# Patient Record
Sex: Female | Born: 1955 | Race: White | Hispanic: No | Marital: Married | Smoking: Never smoker
Health system: Southern US, Community
[De-identification: ages and names within clinical notes are randomized; demographics above are authoritative.]

## PROBLEM LIST (undated history)

## (undated) DIAGNOSIS — R768 Other specified abnormal immunological findings in serum: Secondary | ICD-10-CM

## (undated) DIAGNOSIS — E669 Obesity, unspecified: Secondary | ICD-10-CM

## (undated) DIAGNOSIS — I341 Nonrheumatic mitral (valve) prolapse: Secondary | ICD-10-CM

## (undated) DIAGNOSIS — I7300 Raynaud's syndrome without gangrene: Secondary | ICD-10-CM

## (undated) DIAGNOSIS — I471 Supraventricular tachycardia, unspecified: Secondary | ICD-10-CM

## (undated) DIAGNOSIS — I82409 Acute embolism and thrombosis of unspecified deep veins of unspecified lower extremity: Secondary | ICD-10-CM

## (undated) DIAGNOSIS — M1711 Unilateral primary osteoarthritis, right knee: Secondary | ICD-10-CM

## (undated) DIAGNOSIS — R238 Other skin changes: Secondary | ICD-10-CM

## (undated) DIAGNOSIS — Z8616 Personal history of COVID-19: Secondary | ICD-10-CM

## (undated) DIAGNOSIS — R809 Proteinuria, unspecified: Secondary | ICD-10-CM

## (undated) DIAGNOSIS — M170 Bilateral primary osteoarthritis of knee: Secondary | ICD-10-CM

## (undated) DIAGNOSIS — I743 Embolism and thrombosis of arteries of the lower extremities: Secondary | ICD-10-CM

## (undated) DIAGNOSIS — Z8249 Family history of ischemic heart disease and other diseases of the circulatory system: Secondary | ICD-10-CM

## (undated) DIAGNOSIS — I480 Paroxysmal atrial fibrillation: Secondary | ICD-10-CM

## (undated) DIAGNOSIS — K222 Esophageal obstruction: Secondary | ICD-10-CM

## (undated) DIAGNOSIS — F909 Attention-deficit hyperactivity disorder, unspecified type: Secondary | ICD-10-CM

## (undated) DIAGNOSIS — R233 Spontaneous ecchymoses: Secondary | ICD-10-CM

## (undated) DIAGNOSIS — R1310 Dysphagia, unspecified: Secondary | ICD-10-CM

## (undated) HISTORY — DX: Bilateral primary osteoarthritis of knee: M17.0

## (undated) HISTORY — DX: Embolism and thrombosis of arteries of the lower extremities: I74.3

## (undated) HISTORY — DX: Paroxysmal atrial fibrillation: I48.0

## (undated) HISTORY — DX: Spontaneous ecchymoses: R23.3

## (undated) HISTORY — DX: Other skin changes: R23.8

## (undated) HISTORY — DX: Supraventricular tachycardia, unspecified: I47.10

## (undated) HISTORY — DX: Supraventricular tachycardia: I47.1

## (undated) HISTORY — DX: Dysphagia, unspecified: R13.10

## (undated) HISTORY — DX: Nonrheumatic mitral (valve) prolapse: I34.1

## (undated) HISTORY — PX: VASCULAR SURGERY: SHX849

## (undated) HISTORY — DX: Other specified abnormal immunological findings in serum: R76.8

## (undated) HISTORY — DX: Obesity, unspecified: E66.9

## (undated) HISTORY — DX: Raynaud's syndrome without gangrene: I73.00

## (undated) HISTORY — DX: Family history of ischemic heart disease and other diseases of the circulatory system: Z82.49

## (undated) HISTORY — DX: Unilateral primary osteoarthritis, right knee: M17.11

## (undated) HISTORY — DX: Esophageal obstruction: K22.2

## (undated) HISTORY — DX: Proteinuria, unspecified: R80.9

## (undated) HISTORY — DX: Attention-deficit hyperactivity disorder, unspecified type: F90.9

## (undated) HISTORY — DX: Personal history of COVID-19: Z86.16

## (undated) HISTORY — DX: Acute embolism and thrombosis of unspecified deep veins of unspecified lower extremity: I82.409

---

## 2002-08-15 ENCOUNTER — Ambulatory Visit (HOSPITAL_COMMUNITY): Admission: RE | Admit: 2002-08-15 | Discharge: 2002-08-15 | Payer: Self-pay | Admitting: Internal Medicine

## 2002-08-15 ENCOUNTER — Encounter: Payer: Self-pay | Admitting: Internal Medicine

## 2002-09-02 ENCOUNTER — Encounter: Admission: RE | Admit: 2002-09-02 | Discharge: 2002-09-02 | Payer: Self-pay | Admitting: Internal Medicine

## 2003-01-16 ENCOUNTER — Encounter: Payer: Self-pay | Admitting: Internal Medicine

## 2003-01-16 ENCOUNTER — Encounter: Admission: RE | Admit: 2003-01-16 | Discharge: 2003-01-16 | Payer: Self-pay | Admitting: Internal Medicine

## 2003-04-22 ENCOUNTER — Encounter: Admission: RE | Admit: 2003-04-22 | Discharge: 2003-04-22 | Payer: Self-pay | Admitting: Internal Medicine

## 2004-02-17 ENCOUNTER — Encounter: Admission: RE | Admit: 2004-02-17 | Discharge: 2004-02-17 | Payer: Self-pay | Admitting: Internal Medicine

## 2005-08-10 ENCOUNTER — Emergency Department: Payer: Self-pay | Admitting: Emergency Medicine

## 2011-05-16 ENCOUNTER — Telehealth: Payer: Self-pay | Admitting: Internal Medicine

## 2011-05-16 DIAGNOSIS — F909 Attention-deficit hyperactivity disorder, unspecified type: Secondary | ICD-10-CM

## 2011-05-16 NOTE — Telephone Encounter (Signed)
Pt called she needs rx addrell  40mg  in am  20mg  at lunch  Please call when ready  Pt signed medical release form at [facility]  Pt will be out of meds 05/18/11

## 2011-05-18 MED ORDER — AMPHETAMINE-DEXTROAMPHETAMINE 20 MG PO TABS
ORAL_TABLET | ORAL | Status: DC
Start: 2011-05-18 — End: 2011-06-05

## 2011-05-18 NOTE — Telephone Encounter (Signed)
I have printed out an rx for #90 20 mg tablets of Adderall which is a one month supply on current regimen oof 40 mg am 20 mg pm

## 2011-05-18 NOTE — Telephone Encounter (Signed)
Patient needs a refill on her Adderall.  She stated at her last visit you gave her an Rx that was for 10 mg tablets 2 tabs at 7 am and 2 tabs at noon.  She stated you then called her after that because that dose was not working for her and changed her to 40 mg at 7 am and 20 mg at noon.   I called the pharmacy and they confirmed the first dose you prescribed and stated her last refill was on 04/20/11 and she got 120 tablets.  We do not have her records from the other office and she needs the Rx soon.  Please advise on what dose and directions of Adderall.

## 2011-05-18 NOTE — Telephone Encounter (Signed)
Notified patient she will pick up Rx.

## 2011-06-05 ENCOUNTER — Other Ambulatory Visit: Payer: Self-pay | Admitting: Internal Medicine

## 2011-06-05 DIAGNOSIS — F909 Attention-deficit hyperactivity disorder, unspecified type: Secondary | ICD-10-CM

## 2011-06-05 MED ORDER — AMPHETAMINE-DEXTROAMPHETAMINE 20 MG PO TABS: ORAL_TABLET | ORAL | Status: DC

## 2011-07-31 ENCOUNTER — Other Ambulatory Visit: Payer: Self-pay | Admitting: Internal Medicine

## 2011-07-31 NOTE — Telephone Encounter (Signed)
Patient takes 2- 20 mg tabs in the morning and 1- 20 mg tab in the afternoon. Please write for two months at a time because she lives so far away.

## 2011-08-02 ENCOUNTER — Other Ambulatory Visit: Payer: Self-pay | Admitting: Internal Medicine

## 2011-08-02 DIAGNOSIS — F909 Attention-deficit hyperactivity disorder, unspecified type: Secondary | ICD-10-CM

## 2011-08-02 MED ORDER — AMPHETAMINE-DEXTROAMPHETAMINE 20 MG PO TABS: ORAL_TABLET | ORAL | Status: DC

## 2011-09-05 ENCOUNTER — Other Ambulatory Visit: Payer: Self-pay | Admitting: Internal Medicine

## 2011-09-05 MED ORDER — AMPHETAMINE-DEXTROAMPHETAMINE 20 MG PO TABS: ORAL_TABLET | ORAL | Status: DC

## 2011-09-05 NOTE — Telephone Encounter (Signed)
Patient needing a new script on her  Adderall 60 mg.

## 2011-11-09 ENCOUNTER — Ambulatory Visit: Payer: Self-pay | Admitting: Internal Medicine

## 2011-11-10 ENCOUNTER — Ambulatory Visit: Payer: Self-pay | Admitting: Internal Medicine

## 2011-11-13 ENCOUNTER — Encounter: Payer: Self-pay | Admitting: Internal Medicine

## 2011-11-13 ENCOUNTER — Ambulatory Visit (INDEPENDENT_AMBULATORY_CARE_PROVIDER_SITE_OTHER): Payer: Self-pay | Admitting: Internal Medicine

## 2011-11-13 DIAGNOSIS — F909 Attention-deficit hyperactivity disorder, unspecified type: Secondary | ICD-10-CM

## 2011-11-13 DIAGNOSIS — E669 Obesity, unspecified: Secondary | ICD-10-CM

## 2011-11-13 DIAGNOSIS — E785 Hyperlipidemia, unspecified: Secondary | ICD-10-CM

## 2011-11-13 DIAGNOSIS — F988 Other specified behavioral and emotional disorders with onset usually occurring in childhood and adolescence: Secondary | ICD-10-CM

## 2011-11-13 MED ORDER — ADDERALL 20 MG PO TABS: ORAL_TABLET | ORAL | Status: DC

## 2011-11-13 NOTE — Patient Instructions (Signed)
Consider the Low Glycemic Index Diet and 6 smaller meals daily :  Here is one way of accomplishing this   7 AM Low carbohydrate Protein  Shakes (EAS Carb Control  Or Atkins ,  Available everywhere,   In  cases at BJs )  2.5 carbs  (Add or substitute a toasted sandwhich thin w/ peanut butter)  10 AM: Protein bar by Atkins (snack size,  Chocolate lover's variety at  BJ's)    Lunch: sandwich on pita bread or flatbread (Joseph's makes a pita bread and a flat bread , available at [employer] and [employer]J's; Toufayah makes a low carb flatbread available at [employer] and [employer])   [employer] both make a low carb whole wheat tortillas   3 PM:  Mid day :  Another protein bar,  Or a  cheese stick, 1/4 cup of almonds, walnuts, pistachios, pecans, peanuts,  Macadamia nuts  6 PM  Dinner:  "mean and green:"  Meat/chicken/fish, salad, and green veggie : use ranch, vinagrette,  Blue cheese, etc  9 PM snack : Breyer's low carb fudgiscle or  ice cream bar (Carb Smart),  Weight Watcher's ice cream bar , or another protein shake  _________________________________________________________________________________________________________________

## 2011-11-13 NOTE — Progress Notes (Signed)
Patient ID: Jade Edwards, female   DOB: [birth year], 56 y.o.   MRN: [id number]    Patient Active Problem List  Diagnoses  . Obesity  . 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 Follow-up  . Medication Refill    HPI:   Jade Edwards a 56 y.o. female who presents with for follow up on ADD and obesity.  She has been taking adderall twice daily for management of ADD.  She forgets to take her afternoon dose of medication  2 or 3 times per week.  Her children often remind her to take her dose.  She has incomnia frequently,  And her emotional state has been distracted since her husband was diagnosed with advanced prostate CA recently.   She has lost 29 lbs through diet and exercise but has gained several lbs over the family vacation holiday.  No past medical history on file.  No past surgical history on file.       The following portions of the patient's history were reviewed and updated as appropriate: Allergies, current medications, and problem list.    Review of Systems:   12 Pt  review of systems was negative except those addressed in the HPI,     History   Social History  . Marital Status: Single    Spouse Name: N/A    Number of Children: N/A  . Years of Education: N/A   Occupational History  . Not on file.   Social History Main Topics  . Smoking status: Never Smoker   . Smokeless tobacco: Never Used  . Alcohol Use: No  . Drug Use: No  . Sexually Active: Not on file   Other Topics Concern  . Not on file   Social History Narrative  . No narrative on file    Objective:  BP 128/70  Pulse 100  Temp(Src) 98.2 F (36.8 C) (Oral)  Resp 16  Ht 5' 1.5" (1.562 m)  Wt 224 lb 8 oz (101.833 kg)  BMI 41.73 kg/m2  SpO2 100%  General appearance: alert, cooperative and appears stated age Ears: normal TM's and external ear canals both ears Throat: lips, mucosa, and tongue  normal; teeth and gums normal Neck: no adenopathy, no carotid bruit, supple, symmetrical, trachea midline and thyroid not enlarged, symmetric, no tenderness/mass/nodules Back: symmetric, no curvature. ROM normal. No CVA tenderness. Lungs: clear to auscultation bilaterally Heart: regular rate and rhythm, S1, S2 normal, no murmur, click, rub or gallop Abdomen: soft, non-tender; bowel sounds normal; no masses,  no organomegaly Pulses: 2+ and symmetric Skin: Skin color, texture, turgor normal. No rashes or lesions Lymph nodes: Cervical, supraclavicular, and axillary nodes normal.  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  We decided to continue her current regimen .    Updated Medication List Outpatient Encounter Prescriptions as of 11/13/2011  Medication Sig Dispense Refill  . ADDERALL, 20MG , 20 MG tablet Take two tablets in the morning and one tablet in the afternoon.  180 tablet  0  . DISCONTD: amphetamine-dextroamphetamine (ADDERALL, 20MG ,) 20 MG tablet Take two tablets in the morning and one tablet in the afternoon.  180 tablet  0     Orders Placed This Encounter  Procedures  . TSH  . Lipid panel  . COMPLETE METABOLIC PANEL WITH GFR    No Follow-up  on file.

## 2011-11-14 DIAGNOSIS — F909 Attention-deficit hyperactivity disorder, unspecified type: Secondary | ICD-10-CM | POA: Insufficient documentation

## 2011-11-14 DIAGNOSIS — Z6841 Body Mass Index (BMI) 40.0 and over, adult: Secondary | ICD-10-CM | POA: Insufficient documentation

## 2011-11-14 NOTE — Assessment & Plan Note (Signed)
We discussed the pros and cons of switching her to extended releaese Adderall.  We decided to continue her current regimen .

## 2011-11-22 ENCOUNTER — Other Ambulatory Visit (INDEPENDENT_AMBULATORY_CARE_PROVIDER_SITE_OTHER): Payer: BC Managed Care – PPO | Admitting: *Deleted

## 2011-11-22 DIAGNOSIS — E785 Hyperlipidemia, unspecified: Secondary | ICD-10-CM

## 2011-11-22 LAB — LIPID PANEL
LDL Cholesterol: 115 mg/dL — ABNORMAL HIGH (ref 0–99)
Total CHOL/HDL Ratio: 4
VLDL: 30.8 mg/dL (ref 0.0–40.0)

## 2011-11-22 LAB — COMPLETE METABOLIC PANEL WITH GFR
ALT: 18 U/L (ref 0–35)
AST: 22 U/L (ref 0–37)
Albumin: 4.1 g/dL (ref 3.5–5.2)
Alkaline Phosphatase: 109 U/L (ref 39–117)
Calcium: 8.9 mg/dL (ref 8.4–10.5)
Chloride: 105 mEq/L (ref 96–112)
Potassium: 4.3 mEq/L (ref 3.5–5.3)
Sodium: 141 mEq/L (ref 135–145)
Total Protein: 6.6 g/dL (ref 6.0–8.3)

## 2011-11-23 ENCOUNTER — Encounter: Payer: Self-pay | Admitting: Internal Medicine

## 2012-01-17 ENCOUNTER — Telehealth: Payer: Self-pay | Admitting: Internal Medicine

## 2012-01-17 MED ORDER — ADDERALL 20 MG PO TABS: ORAL_TABLET | ORAL | Status: DC

## 2012-01-17 NOTE — Telephone Encounter (Signed)
Refill Adderall 20 mg  For 2 months.

## 2012-01-17 NOTE — Telephone Encounter (Signed)
Rx printed waiting on signature.

## 2012-04-08 ENCOUNTER — Other Ambulatory Visit: Payer: Self-pay | Admitting: Internal Medicine

## 2012-04-08 NOTE — Telephone Encounter (Signed)
Refill on Adderall 20 mg 2 months.

## 2012-04-09 MED ORDER — ADDERALL 20 MG PO TABS: ORAL_TABLET | ORAL | Status: DC

## 2012-04-11 ENCOUNTER — Encounter: Payer: Self-pay | Admitting: Internal Medicine

## 2012-06-11 ENCOUNTER — Other Ambulatory Visit: Payer: Self-pay | Admitting: Internal Medicine

## 2012-06-11 NOTE — Telephone Encounter (Signed)
Refill on Adderall 20 mg. °

## 2012-06-12 MED ORDER — ADDERALL 20 MG PO TABS: ORAL_TABLET | ORAL | Status: DC

## 2012-06-12 NOTE — Telephone Encounter (Signed)
Ok to refill,  Authorized in epic and printed  

## 2012-06-13 NOTE — Telephone Encounter (Signed)
Patient's husband came by and picked prescription up.

## 2012-08-22 ENCOUNTER — Other Ambulatory Visit: Payer: Self-pay | Admitting: Internal Medicine

## 2012-08-22 DIAGNOSIS — F909 Attention-deficit hyperactivity disorder, unspecified type: Secondary | ICD-10-CM

## 2012-08-22 NOTE — Telephone Encounter (Signed)
Pt need addrell  refill  Pt would like 2 month at a time Please advise when ready to be picked

## 2012-08-23 MED ORDER — ADDERALL 20 MG PO TABS: ORAL_TABLET | ORAL | Status: DC

## 2012-08-23 MED ORDER — AMPHETAMINE-DEXTROAMPHETAMINE 20 MG PO TABS: ORAL_TABLET | ORAL | Status: DC

## 2012-08-23 NOTE — Telephone Encounter (Signed)
Patient's husband would like to pick this up today around 2:30.  She will be out after today.

## 2012-08-23 NOTE — Telephone Encounter (Signed)
Dr. Darrick Huntsman put in drawer at front desk.

## 2012-08-23 NOTE — Telephone Encounter (Signed)
Ok to refill,  Authorized in epic on printer 

## 2012-09-04 ENCOUNTER — Ambulatory Visit: Payer: BC Managed Care – PPO | Admitting: Internal Medicine

## 2012-09-13 ENCOUNTER — Ambulatory Visit: Payer: BC Managed Care – PPO | Admitting: Internal Medicine

## 2012-09-23 ENCOUNTER — Ambulatory Visit (INDEPENDENT_AMBULATORY_CARE_PROVIDER_SITE_OTHER): Payer: BC Managed Care – PPO | Admitting: Internal Medicine

## 2012-09-23 ENCOUNTER — Encounter: Payer: Self-pay | Admitting: Internal Medicine

## 2012-09-23 VITALS — BP 130/74 | HR 79 | Temp 97.9°F | Resp 16 | Wt 233.0 lb

## 2012-09-23 DIAGNOSIS — E669 Obesity, unspecified: Secondary | ICD-10-CM

## 2012-09-23 DIAGNOSIS — Z1239 Encounter for other screening for malignant neoplasm of breast: Secondary | ICD-10-CM

## 2012-09-23 DIAGNOSIS — M5430 Sciatica, unspecified side: Secondary | ICD-10-CM

## 2012-09-23 DIAGNOSIS — M25559 Pain in unspecified hip: Secondary | ICD-10-CM

## 2012-09-23 DIAGNOSIS — M25551 Pain in right hip: Secondary | ICD-10-CM

## 2012-09-23 DIAGNOSIS — G8929 Other chronic pain: Secondary | ICD-10-CM | POA: Insufficient documentation

## 2012-09-23 DIAGNOSIS — F909 Attention-deficit hyperactivity disorder, unspecified type: Secondary | ICD-10-CM

## 2012-09-23 DIAGNOSIS — M549 Dorsalgia, unspecified: Secondary | ICD-10-CM

## 2012-09-23 DIAGNOSIS — M5431 Sciatica, right side: Secondary | ICD-10-CM

## 2012-09-23 MED ORDER — IBUPROFEN 800 MG PO TABS: 800.0000 mg | ORAL_TABLET | Freq: Three times a day (TID) | ORAL | Status: DC | PRN

## 2012-09-23 MED ORDER — METHOCARBAMOL 500 MG PO TABS: 500.0000 mg | ORAL_TABLET | Freq: Three times a day (TID) | ORAL | Status: DC

## 2012-09-23 NOTE — Patient Instructions (Addendum)
Ibuprofen 800 mg every 8 hours, plus muscle relaxer at night,  . Add tylenol up to 4 tylenol daily (2000 mg  daily max dose)   Plain films of right hip and lower spine   ----------------------------------------------------------------------------------------------------------------------------------------------------- You can lose 10 to 20%  Of your current body weight over the next 6 months   Try walking at a fast pace for 20 minutes daily   This is  An example of  a  "Low GI"  Diet: You can use the Body by V shakes twice  And use these other suggestions for your other meals.  . You should eat every 3 hours to stimulate your metabolism     7 AM Breakfast:  Low carbohydrate Protein  Shakes (I recommend the EAS AdvantEdge "Carb Control" shakes  Or the low carb shakes by Atkins.   Both are available everywhere:  In  cases at BJs  Or in 4 packs at grocery stores and pharmacies  2.5 carbs  (Alternative is  a toasted Arnold's Sandwhich Thin w/ peanut butter, a "Bagel Thin" with cream cheese and salmon) or  a scrambled egg burrito made with a low carb tortilla .  Avoid cereal and bananas, oatmeal too unless you are cooking the old fashioned kind that takes 30-40 minutes to prepare.  the rest is overly processed, has minimal fiber, and is loaded with carbohydrates!   10 AM: Protein bar by Atkins (the snack size, under 200 cal).  There are many varieties , available widely again or in bulk in limited varieties at BJs)  Other so called "protein bars" tend to be loaded with carbohydrates.  Remember, in food advertising, the word "energy" is synonymous for " carbohydrate."  Lunch: sandwich of [place], (or any lunchmeat, grilled meat or canned tuna), fresh avocado, mayonnaise  and cheese on a lower carbohydrate pita bread, flatbread, or tortilla . Ok to use regular mayonnaise. The bread is the only source or carbohydrate that can be decreased (Joseph's makes a pita bread and a flat bread that are 50 cal and 4 net  carbs ; Toufayan makes a low carb flatbread that's 100 cal and 9 net carbs  and  [place] makes a low carb whole wheat tortilla  That is 210 cal and 6 net carbs)  3 PM:  Mid day :  Another protein bar,  Or a  cheese stick (100 cal, 0 carbs),  Or 1 ounce of  almonds, walnuts, pistachios, pecans, peanuts,  Macadamia nuts. Or a Dannon light n Fit greek yogurt, 80 cal 8 net carbs . Avoid "granola"; the dried cranberries and raisins are loaded with carbohydrates. Mixed nuts ok if no raisins or cranberries or dried fruit.      6 PM  Dinner:  "mean and green:"  Meat/chicken/fish or a high protein legume; , with a green salad, and a low GI  Veggie (broccoli, cauliflower, green beans, spinach, brussel sprouts. Lima beans) : Avoid "Low fat dressings, as well as Reyne Dumas and [address]! They are loaded with sugar! Instead use ranch, vinagrette,  Blue cheese, etc.  There is a low carb pasta by Dreamfield's available at [facility] that is acceptable and tastes great. Try Michel Angelo's chicken piccata over low carb pasta. The chicken dish is 0 carbs, and can be found in frozen section at BJs and [employer]. Also try [employer] "Carnitas" (pulled pork, no sauce,  0 carbs) and his pot roast.   both are in the refrigerated section at BJs   Dreamfield's  makes a low carb pasta only 5 g/serving.  Available at all grocery stores,  And tastes like normal pasta  9 PM snack : Breyer's "low carb" fudgsicle or  ice cream bar (Carb Smart line), or  Weight Watcher's ice cream bar , or another "no sugar added" ice cream;a serving of fresh berries/cherries with whipped cream (Avoid bananas, pineapple, grapes  and watermelon on a regular basis because they are high in sugar)   Remember that snack Substitutions should be less than 10 carbs per serving and meals < 20 carbs. Remember to subtract fiber grams and sugar alcohols to get the "net carbs."

## 2012-09-23 NOTE — Assessment & Plan Note (Signed)
Managed with adderall.  No escalation in use.  Continue refill.

## 2012-09-23 NOTE — Assessment & Plan Note (Signed)
Her BMI remains increased compared ot last visit March 2013.Recommend exercising vigorouslyto achieve a sustained heart rate in the aerobic zone. I suggested that she consider joining a gym and using a personal trainer to help guide her efforts.   I also am advising her to get back on the low GI diet using six smaller meals a day to stimulate her metabolism.

## 2012-09-23 NOTE — Progress Notes (Signed)
Patient ID: Jade Edwards, female   DOB: 08-23-1955, 57 y.o.   MRN: [id number]  Patient Active Problem List  Diagnosis  . Obesity  . 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 Chronic back pain greater than 3 months duration    Subjective:  CC:   Chief Complaint  Patient presents with  . Follow-up    Back & Leg pain    HPI:   Jade Edwards a 57 y.o. female who presents Follow up on multiple medical issues  1) She has a 1 yr history of right achilles tendon injury while playing volleyball.  Took several months for tendereness to resolve.  Still has foot pain in the early morning, located under the ball of foot.  Also started having right LLE lateral pain with walking.  Now with left buttock pain radiating to hip and groin., aggravated by lying on left side and by sitting.  Deep diffuse pain.  Started afer carrying a 30 lb baby on left hip for 0.5 mile walk  in September.  Her pain has become progressively worse.   2) ADD.  She continues to take adderall twice daily  With no escalation in use or adverse effects   3) Obesity:  She is frustrated with her weight gain after previously losing 44 lbs and gaining most of it back.  She has joined [employer] with family and plans to start a diet and a regular regimen of exercise.    History reviewed. No pertinent past medical history.  History reviewed. No pertinent past surgical history.       The following portions of the patient's history were reviewed and updated as appropriate: Allergies, current medications, and problem list.    Review of Systems:   Patient denies headache, fevers, malaise, unintentional weight loss, skin rash, eye pain, sinus congestion and sinus pain, sore throat, dysphagia,  hemoptysis , cough, dyspnea, wheezing, chest pain, palpitations, orthopnea, edema, abdominal pain, nausea, melena, diarrhea, constipation, flank pain, dysuria, hematuria, urinary  Frequency, nocturia, numbness,  tingling, seizures,  Focal weakness, Loss of consciousness,  Tremor, insomnia, depression, anxiety, and suicidal ideation.     History   Social History  . Marital Status: Married    Spouse Name: N/A    Number of Children: N/A  . Years of Education: N/A   Occupational History  . Not on file.   Social History Main Topics  . Smoking status: Never Smoker   . Smokeless tobacco: Never Used  . Alcohol Use: No  . Drug Use: No  . Sexually Active: Not on file   Other Topics Concern  . Not on file   Social History Narrative  . No narrative on file    Objective:  BP 130/74  Pulse 79  Temp 97.9 F (36.6 C) (Oral)  Resp 16  Wt 233 lb (105.688 kg)  SpO2 98%  General appearance: alert, cooperative and appears stated age Ears: normal TM's and external ear canals both ears Throat: lips, mucosa, and tongue normal; teeth and gums normal Neck: no adenopathy, no carotid bruit, supple, symmetrical, trachea midline and thyroid not enlarged, symmetric, no tenderness/mass/nodules Back: symmetric, no curvature. ROM normal. No CVA tenderness. Lungs: clear to auscultation bilaterally Heart: regular rate and rhythm, S1, S2 normal, no murmur, click, rub or gallop Abdomen: soft, non-tender; bowel sounds normal; no masses,  no organomegaly Pulses: 2+ and symmetric Skin: Skin color, texture, turgor normal. No rashes or lesions Lymph nodes: Cervical, supraclavicular, and axillary nodes normal.  Assessment  and Plan:  Obesity Her BMI remains increased compared ot last visit March 2013.Recommend exercising vigorouslyto achieve a sustained heart rate in the aerobic zone. I suggested that she consider joining a gym and using a personal trainer to help guide her efforts.   I also am advising her to get back on the low GI diet using six smaller meals a day to stimulate her metabolism.    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  No escalation in use.  Continue  refill.   Sciatica of right side Negative staight leg lift. DTRs normal.  Plain films of spin and hip,  Prednisone taper and PT pending evaluation of w rays    Updated Medication List Outpatient Encounter Prescriptions as of 09/23/2012  Medication Sig Dispense Refill  . ADDERALL 20 MG tablet Take two tablets in the morning and one tablet in the afternoon.  180 tablet  0  . ibuprofen (ADVIL,MOTRIN) 800 MG tablet Take 1 tablet (800 mg total) by mouth every 8 (eight) hours as needed for pain.  30 tablet  0  . methocarbamol (ROBAXIN) 500 MG tablet Take 1 tablet (500 mg total) by mouth 3 (three) times daily.  30 tablet  2  . [DISCONTINUED] amphetamine-dextroamphetamine (ADDERALL) 20 MG tablet 2 tablets in the morning, 1 tablet in the afternoon  90 tablet  0  . [DISCONTINUED] amphetamine-dextroamphetamine (ADDERALL, 20MG ,) 20 MG tablet 2 tablets in the morning, 1 tablet in the afternoon  90 tablet  0     Orders Placed This Encounter  Procedures  . DG Arthro Hip Right  . DG Lumbar Spine Complete  . MM Digital Screening    No Follow-up on file.

## 2012-09-23 NOTE — Assessment & Plan Note (Signed)
Negative staight leg lift. DTRs normal.  Plain films of spin and hip,  Prednisone taper and PT pending evaluation of w rays

## 2012-09-25 ENCOUNTER — Ambulatory Visit (INDEPENDENT_AMBULATORY_CARE_PROVIDER_SITE_OTHER)
Admission: RE | Admit: 2012-09-25 | Discharge: 2012-09-25 | Disposition: A | Discharge status: Home / Self Care | Payer: BC Managed Care – PPO | Source: Ambulatory Visit | Attending: Adult Health | Admitting: Adult Health

## 2012-09-25 ENCOUNTER — Other Ambulatory Visit: Payer: Self-pay | Admitting: Adult Health

## 2012-09-25 ENCOUNTER — Ambulatory Visit (INDEPENDENT_AMBULATORY_CARE_PROVIDER_SITE_OTHER)
Admission: RE | Admit: 2012-09-25 | Discharge: 2012-09-25 | Disposition: A | Discharge status: Home / Self Care | Payer: BC Managed Care – PPO | Source: Ambulatory Visit | Attending: Internal Medicine | Admitting: Internal Medicine

## 2012-09-25 DIAGNOSIS — M25559 Pain in unspecified hip: Secondary | ICD-10-CM

## 2012-09-25 DIAGNOSIS — R52 Pain, unspecified: Secondary | ICD-10-CM

## 2012-09-25 DIAGNOSIS — M25551 Pain in right hip: Secondary | ICD-10-CM

## 2012-09-25 NOTE — Addendum Note (Signed)
Addended by: Novella Olive on: 09/25/2012 12:34 PM   Modules accepted: Orders

## 2012-10-01 ENCOUNTER — Telehealth: Payer: Self-pay | Admitting: Emergency Medicine

## 2012-10-01 NOTE — Telephone Encounter (Signed)
I called pt to make her aware of her upcoming mammogram apt. The patient was worried that she hadn't heard from anyone in regards to stopping her ibuprofen, or what she should be doing. She spoke with someone yesterday who said they "would get back to her" and she has yet to hear anything. Please advise.

## 2012-10-01 NOTE — Telephone Encounter (Signed)
Called pt back LMOVM to return call.

## 2012-10-03 ENCOUNTER — Telehealth: Payer: Self-pay | Admitting: Internal Medicine

## 2012-10-03 DIAGNOSIS — M25552 Pain in left hip: Secondary | ICD-10-CM

## 2012-10-03 MED ORDER — TRAMADOL HCL 50 MG PO TABS: 50.0000 mg | ORAL_TABLET | Freq: Four times a day (QID) | ORAL | Status: DC | PRN

## 2012-10-03 MED ORDER — PANTOPRAZOLE SODIUM 40 MG PO TBEC: 40.0000 mg | DELAYED_RELEASE_TABLET | Freq: Every day | ORAL | Status: DC

## 2012-10-03 NOTE — Telephone Encounter (Signed)
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  1) PT eval would be helpful.  Referral ordered 2)  She can continue to take ibuprofen unless it is bothering her stomach,  If it is ,  She needs to Stop immediately and start taking the rx I am calling in for her stomach,  Protonix.  One tablet daily. Even if her stomach is not bothering her ,  She should take the protonix if she is going to continue using ibuprofen,   3) And I will send rx for tramadol to her pharmacy  For alternative or concurrent use .  It is a pain reliever and will not bother her stomach.

## 2012-10-07 NOTE — Telephone Encounter (Signed)
Pt.notified

## 2012-11-01 ENCOUNTER — Other Ambulatory Visit: Payer: Self-pay | Admitting: General Practice

## 2012-11-01 MED ORDER — ADDERALL 20 MG PO TABS: ORAL_TABLET | ORAL | Status: DC

## 2012-11-01 NOTE — Telephone Encounter (Signed)
Adderrall last filled 1/14.

## 2012-11-01 NOTE — Telephone Encounter (Signed)
Ok to refill adderall ,  printed rx

## 2012-11-04 NOTE — Telephone Encounter (Signed)
Pt aware this Rx is ready for pick up.

## 2012-11-15 ENCOUNTER — Encounter: Payer: Self-pay | Admitting: Internal Medicine

## 2012-11-18 ENCOUNTER — Other Ambulatory Visit (HOSPITAL_COMMUNITY)
Admission: RE | Admit: 2012-11-18 | Discharge: 2012-11-18 | Disposition: A | Discharge status: Home / Self Care | Payer: BC Managed Care – PPO | Source: Ambulatory Visit | Attending: Internal Medicine | Admitting: Internal Medicine

## 2012-11-18 ENCOUNTER — Encounter: Payer: Self-pay | Admitting: Internal Medicine

## 2012-11-18 ENCOUNTER — Ambulatory Visit (INDEPENDENT_AMBULATORY_CARE_PROVIDER_SITE_OTHER): Payer: BC Managed Care – PPO | Admitting: Internal Medicine

## 2012-11-18 VITALS — BP 128/74 | HR 90 | Temp 97.4°F | Resp 18 | Ht 61.0 in | Wt 229.5 lb

## 2012-11-18 DIAGNOSIS — Z1151 Encounter for screening for human papillomavirus (HPV): Secondary | ICD-10-CM | POA: Insufficient documentation

## 2012-11-18 DIAGNOSIS — M7661 Achilles tendinitis, right leg: Secondary | ICD-10-CM

## 2012-11-18 DIAGNOSIS — R5381 Other malaise: Secondary | ICD-10-CM

## 2012-11-18 DIAGNOSIS — F909 Attention-deficit hyperactivity disorder, unspecified type: Secondary | ICD-10-CM

## 2012-11-18 DIAGNOSIS — Z01419 Encounter for gynecological examination (general) (routine) without abnormal findings: Secondary | ICD-10-CM | POA: Insufficient documentation

## 2012-11-18 DIAGNOSIS — I83811 Varicose veins of right lower extremities with pain: Secondary | ICD-10-CM | POA: Insufficient documentation

## 2012-11-18 DIAGNOSIS — R5383 Other fatigue: Secondary | ICD-10-CM

## 2012-11-18 DIAGNOSIS — Z0000 Encounter for general adult medical examination without abnormal findings: Secondary | ICD-10-CM | POA: Insufficient documentation

## 2012-11-18 DIAGNOSIS — M7660 Achilles tendinitis, unspecified leg: Secondary | ICD-10-CM

## 2012-11-18 DIAGNOSIS — I83893 Varicose veins of bilateral lower extremities with other complications: Secondary | ICD-10-CM

## 2012-11-18 DIAGNOSIS — E669 Obesity, unspecified: Secondary | ICD-10-CM

## 2012-11-18 DIAGNOSIS — M6788 Other specified disorders of synovium and tendon, other site: Secondary | ICD-10-CM | POA: Insufficient documentation

## 2012-11-18 LAB — CBC WITH DIFFERENTIAL/PLATELET
Basophils Absolute: 0 10*3/uL (ref 0.0–0.1)
HCT: 42.7 % (ref 36.0–46.0)
Hemoglobin: 14.1 g/dL (ref 12.0–15.0)
Lymphs Abs: 2.1 10*3/uL (ref 0.7–4.0)
MCHC: 33.2 g/dL (ref 30.0–36.0)
Monocytes Relative: 5.3 % (ref 3.0–12.0)
Neutro Abs: 3.4 10*3/uL (ref 1.4–7.7)
RDW: 13.8 % (ref 11.5–14.6)

## 2012-11-18 LAB — COMPREHENSIVE METABOLIC PANEL
ALT: 22 U/L (ref 0–35)
BUN: 16 mg/dL (ref 6–23)
CO2: 25 mEq/L (ref 19–32)
Creatinine, Ser: 0.9 mg/dL (ref 0.4–1.2)
GFR: 69.98 mL/min (ref >60.00)
Glucose, Bld: 89 mg/dL (ref 70–99)
Total Bilirubin: 0.4 mg/dL (ref 0.3–1.2)

## 2012-11-18 LAB — LIPID PANEL
Cholesterol: 209 mg/dL — ABNORMAL HIGH (ref 0–200)
HDL: 48.3 mg/dL (ref >39.00)
Triglycerides: 172 mg/dL — ABNORMAL HIGH (ref 0.0–149.0)
VLDL: 34.4 mg/dL (ref 0.0–40.0)

## 2012-11-18 LAB — TSH: TSH: 0.44 u[IU]/mL (ref 0.35–5.50)

## 2012-11-18 MED ORDER — OXYCODONE-ACETAMINOPHEN 5-325 MG PO TABS: 1.0000 | ORAL_TABLET | Freq: Three times a day (TID) | ORAL | Status: DC | PRN

## 2012-11-18 MED ORDER — ADDERALL 20 MG PO TABS: ORAL_TABLET | ORAL | Status: DC

## 2012-11-18 NOTE — Patient Instructions (Addendum)

## 2012-11-18 NOTE — Progress Notes (Signed)
Patient ID: Jade Edwards, female   DOB: [birth year], 57 y.o.   MRN: [id number]   Subjective:     Jade Edwards is a 57 y.o. female and is here for a comprehensive physical exam. The patient reports  A 2 yr history of right achilles tendon injury while playing volleyball.  Took several months for tenderness to resolve.  Still has foot pain in the early morning, located under the ball of foot and persistent tenderness alon gthe achilles tendon with some edema noted, along with varicose veins on same side .    2) ADD.  She continues to take adderall twice daily  With no escalation in use or adverse effects   3) Obesity:  She is frustrated with her weight gain after previously losing 44 lbs and gaining most of it back.  She has joined [employer] with family and plans to start a diet and a regular regimen of exercise.   History   Social History  . Marital Status: Single    Spouse Name: N/A    Number of Children: N/A  . Years of Education: N/A   Occupational History  . Not on file.   Social History Main Topics  . Smoking status: Never Smoker   . Smokeless tobacco: Never Used  . Alcohol Use: No  . Drug Use: No  . Sexually Active: Not on file   Other Topics Concern  . Not on file   Social History Narrative  . No narrative on file   Health Maintenance  Topic Date Due  . Influenza Vaccine  04/21/1958  . Tetanus/tdap  11/12/1976  . Colonoscopy  11/13/2007  . Pap Smear  02/20/2014  . Mammogram  10/31/2014    The following portions of the patient's history were reviewed and updated as appropriate: allergies, current medications, past family history, past medical history, past social history, past surgical history and problem list.  Review of Systems A comprehensive review of systems was negative.   Objective:   BP 128/74  Pulse 90  Temp(Src) 97.4 F (36.3 C) (Oral)  Resp 18  Ht 5\' 1"  (1.549 m)  Wt 229 lb 8 oz (104.101 kg)  BMI 43.39 kg/m2  SpO2 94%  General  Appearance:    Alert, cooperative, no distress, appears stated age  Head:    Normocephalic, without obvious abnormality, atraumatic  Eyes:    PERRL, conjunctiva/corneas clear, EOM's intact, fundi    benign, both eyes  Ears:    Normal TM's and external ear canals, both ears  Nose:   Nares normal, septum midline, mucosa normal, no drainage    or sinus tenderness  Throat:   Lips, mucosa, and tongue normal; teeth and gums normal  Neck:   Supple, symmetrical, trachea midline, no adenopathy;    thyroid:  no enlargement/tenderness/nodules; no carotid   bruit or JVD  Back:     Symmetric, no curvature, ROM normal, no CVA tenderness  Lungs:     Clear to auscultation bilaterally, respirations unlabored  Chest Wall:    No tenderness or deformity   Heart:    Regular rate and rhythm, S1 and S2 normal, no murmur, rub   or gallop  Breast Exam:    Pendulous, No tenderness, masses, or nipple abnormality  Abdomen:     Soft, non-tender, bowel sounds active all four quadrants,    no masses, no organomegaly  Genitalia:    Normal female without lesion, discharge or tenderness  Extremities:   Extremities normal, atraumatic, no cyanosis. Varicose  veins  in re pronounced right calf and ankle, no ulcers     Pulses:   2+ and symmetric all extremities  Skin:   Excessive skin damage from tanning.  no rashes or lesions  Lymph nodes:   Cervical, supraclavicular, and axillary nodes normal  Neurologic:   CNII-XII intact, normal strength, sensation and reflexes    throughout    Assessment:   Achilles tendinosis She has had persistent pain in her right Achilles tendon for 2 years accompanied by swelling. It is unclear how much of the swelling is due to concurrent  varicose veins and venous insufficiency. She recalls an injury during a softball game  several years ago with no subsequent orthopedic evaluation. Her altlered gait has caused subseuqent right hip pain , which was evaluated with normal appearing hip films  several months ago. Will refer to [facility] orthopedics for evaluation of Achilles tendinosis. And she is going to [place] for several weeks with her family on vacation and is requesting a samll supply of Percocet to mange night time pain that is keeping her awake.  She will continue to use her motrin and tramadol 3 times a day for daytime pain.   Routine general medical examination at a health care facility Annual comprehensive exam was done including breast, pelvic and PAP smear. All screenings have been addressed .   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 She is with medications and is never requesting an early refill. Will refill for 60 days at a time. Prescription for April /May was given today   Obesity I have addressed  BMI and recommended a low glycemic index diet utilizing smaller more frequent meals to increase metabolism.  I have also recommended that patient start exercising with a goal of 30 minutes of aerobic exercise a minimum of 5 days per week. Screening for lipid disorders, thyroid and diabetes to be done today.     Updated Medication List Outpatient Encounter Prescriptions as of 11/18/2012  Medication Sig Dispense Refill  . ADDERALL 20 MG tablet Take two tablets in the morning and one tablet in the afternoon.  180 tablet  0  . ibuprofen (ADVIL,MOTRIN) 800 MG tablet Take 1 tablet (800 mg total) by mouth every 8 (eight) hours as needed for pain.  30 tablet  0  . methocarbamol (ROBAXIN) 500 MG tablet Take 1 tablet (500 mg total) by mouth 3 (three) times daily.  30 tablet  2  . pantoprazole (PROTONIX) 40 MG tablet Take 1 tablet (40 mg total) by mouth daily.  30 tablet  3  . traMADol (ULTRAM) 50 MG tablet Take 1 tablet (50 mg total) by mouth every 6 (six) hours as needed for pain.  120 tablet  3  . [DISCONTINUED] ADDERALL 20 MG tablet Take two tablets in the morning and one tablet in the afternoon.  180 tablet  0  . oxyCODONE-acetaminophen  (ROXICET) 5-325 MG per tablet Take 1 tablet by mouth every 8 (eight) hours as needed for pain.  20 tablet  0   No facility-administered encounter medications on file as of 11/18/2012.

## 2012-11-18 NOTE — Assessment & Plan Note (Signed)
I have addressed  BMI and recommended a low glycemic index diet utilizing smaller more frequent meals to increase metabolism.  I have also recommended that patient start exercising with a goal of 30 minutes of aerobic exercise a minimum of 5 days per week. Screening for lipid disorders, thyroid and diabetes to be done today.   

## 2012-11-18 NOTE — Assessment & Plan Note (Signed)
Annual comprehensive exam was done including breast, pelvic and PAP smear. All screenings have been addressed .  

## 2012-11-18 NOTE — Assessment & Plan Note (Signed)
She has had persistent pain in her right Achilles tendon for 2 years accompanied by swelling. It is unclear how much of the swelling is due to concurrent  varicose veins and venous insufficiency. She recalls an injury during a softball game  several years ago with no subsequent orthopedic evaluation. Her altlered gait has caused subseuqent right hip pain , which was evaluated with normal appearing hip films several months ago. Will refer to [facility] orthopedics for evaluation of Achilles tendinosis.

## 2012-11-18 NOTE — Assessment & Plan Note (Addendum)
Managed with adderall. She is with medications and is never requesting an early refill. Will refill for 60 days at a time. Prescription for April /May was given today

## 2012-11-20 ENCOUNTER — Encounter: Payer: Self-pay | Admitting: General Practice

## 2012-11-20 NOTE — Progress Notes (Signed)
Letter mailed to pt.  

## 2012-12-17 LAB — HM PAP SMEAR: HM PAP: NORMAL

## 2013-01-14 ENCOUNTER — Telehealth: Payer: Self-pay | Admitting: *Deleted

## 2013-01-14 NOTE — Telephone Encounter (Signed)
Refill Request  Cyanocobalamin 1000 mcg  #12  Inject 1 ML weekly for 2 weeks, then inject 1 mL monthly

## 2013-01-15 NOTE — Telephone Encounter (Signed)
Patient sates she will call back for lab appointment.

## 2013-01-15 NOTE — Telephone Encounter (Signed)
I cannot write a rx for b12 injecable medication bc she does not have a b12 deficiency documented

## 2013-01-15 NOTE — Telephone Encounter (Signed)
Advise as to refill please.

## 2013-03-10 ENCOUNTER — Other Ambulatory Visit: Payer: Self-pay | Admitting: Internal Medicine

## 2013-03-10 NOTE — Telephone Encounter (Signed)
Last visit 11/18/12.

## 2013-03-10 NOTE — Telephone Encounter (Signed)
Needs refill on Adderall 40 mg a.m., 20 mg at lunch.  Usually gets filled for 2 months at a time.  Please let pt know when this is ready for pickup

## 2013-03-11 ENCOUNTER — Telehealth: Payer: Self-pay | Admitting: Internal Medicine

## 2013-03-11 MED ORDER — ADDERALL 20 MG PO TABS: ORAL_TABLET | ORAL | Status: DC

## 2013-03-11 NOTE — Telephone Encounter (Signed)
Patient notified and script placed up front. 

## 2013-03-11 NOTE — Telephone Encounter (Signed)
Pt states she called in regarding her Adderall rx yesterday.  Pt states her spouse was coming today to pick that up for her.  Do not see that this has been done.  Advised pt.  Pt asking if we can have this ready today as she is out as of today.

## 2013-03-11 NOTE — Telephone Encounter (Signed)
Ok to refill,  printed rx  

## 2013-05-13 ENCOUNTER — Telehealth: Payer: Self-pay | Admitting: Internal Medicine

## 2013-05-13 NOTE — Telephone Encounter (Signed)
Patient calling requesting a refill on her ADDERALL 20 MG tablet. The patient stated she normally gets a 2 month supply. Patient ok with 1 month since she has an apt next Friday @ 945. Please advise.

## 2013-05-14 MED ORDER — ADDERALL 20 MG PO TABS: ORAL_TABLET | ORAL | Status: DC

## 2013-05-14 NOTE — Telephone Encounter (Signed)
Last OV 3/14 and patient has appointment on 05/23/13 please advise as to refill.

## 2013-05-14 NOTE — Telephone Encounter (Signed)
One month refill ok

## 2013-05-14 NOTE — Telephone Encounter (Signed)
Patient notified to pick up script.

## 2013-05-16 ENCOUNTER — Encounter: Payer: Self-pay | Admitting: *Deleted

## 2013-05-19 ENCOUNTER — Telehealth: Payer: Self-pay | Admitting: Internal Medicine

## 2013-05-19 MED ORDER — AMPHETAMINE-DEXTROAMPHETAMINE 20 MG PO TABS: ORAL_TABLET | ORAL | Status: DC

## 2013-05-19 NOTE — Telephone Encounter (Signed)
No, if you can confirm with pharmacy that they will destroy it you can give her the generic rx i have signed

## 2013-05-19 NOTE — Telephone Encounter (Signed)
Patient requesting generic script please advise if patient needs bring back the script with the name brand, states pharmacy has script.

## 2013-05-19 NOTE — Telephone Encounter (Signed)
Pt picked up rx for addrell.  Dispense as written   Pt needs the generic rx for this.   [facility] [place] [phone] still has her rx  Does she need to pick this up before picking up new rx  Pt has been out of meds for 4 days now and need to pick up today Please advise pt

## 2013-05-19 NOTE — Telephone Encounter (Signed)
Patient notified script ready for pick up

## 2013-05-23 ENCOUNTER — Ambulatory Visit (INDEPENDENT_AMBULATORY_CARE_PROVIDER_SITE_OTHER): Payer: BC Managed Care – PPO | Admitting: Internal Medicine

## 2013-05-23 ENCOUNTER — Encounter: Payer: Self-pay | Admitting: Internal Medicine

## 2013-05-23 VITALS — BP 128/78 | HR 95 | Temp 97.6°F | Resp 14 | Ht 61.0 in | Wt 234.5 lb

## 2013-05-23 DIAGNOSIS — F909 Attention-deficit hyperactivity disorder, unspecified type: Secondary | ICD-10-CM

## 2013-05-23 DIAGNOSIS — Z23 Encounter for immunization: Secondary | ICD-10-CM

## 2013-05-23 DIAGNOSIS — E538 Deficiency of other specified B group vitamins: Secondary | ICD-10-CM

## 2013-05-23 DIAGNOSIS — Z6841 Body Mass Index (BMI) 40.0 and over, adult: Secondary | ICD-10-CM

## 2013-05-23 MED ORDER — AMPHETAMINE-DEXTROAMPHETAMINE 20 MG PO TABS: 40.0000 mg | ORAL_TABLET | Freq: Two times a day (BID) | ORAL | Status: DC

## 2013-05-23 NOTE — Patient Instructions (Addendum)
We are gong to try increasing your adderall dose to 40 mg wice  Daily for a month   If this does not improve your late afternoon attention span/concentration, the next step would be to try adderall LA   Try incorporating some of these principles into your diet.    Your goal with exercise is a minimum of 30 minutes of aerobic exercise 5 days per week (Walking does not count once it becomes easy!)    All of the foods can be found at grocery stores and in bulk at [employer].  The Atkins protein bars and shakes are available in more varieties at Target, WalMart and [employer]'s Foods.     7 AM Breakfast:  Choose from the following:  Low carbohydrate Protein  Shakes (I recommend the [employer] "Carb Control" shakes  Or the low carb shakes by Atkins.    2.5 carbs   Arnold's "Sandwhich Thin"toasted  w/ peanut butter (no jelly: about 20 net carbs  "Bagel Thin" with cream cheese and salmon: about 20 carbs   a scrambled egg/bacon/cheese burrito made with Mission's "carb balance" whole wheat tortilla  (about 10 net carbs )   Avoid cereal and bananas, oatmeal and cream of wheat and grits. They are loaded with carbohydrates!   10 AM: high protein snack  Protein bar by Atkins (the snack size, under 200 cal, usually < 6 net carbs).    A stick of cheese:  Around 1 carb,  100 cal     Dannon Light n Fit [place] Yogurt  (80 cal, 8 carbs)  Other so called "protein bars" and Greek yogurts tend to be loaded with carbohydrates.  Remember, in food advertising, the word "energy" is synonymous for " carbohydrate."  Lunch:   A Sandwich using the bread choices listed, Can use any  Eggs,  lunchmeat, grilled meat or canned tuna), avocado, regular mayo/mustard  and cheese.  A Salad using blue cheese, ranch,  Goddess or vinagrette,  No croutons or "confetti" and no "candied nuts" but regular nuts OK.   No pretzels or chips.  Pickles and miniature sweet peppers are a good low carb alternative that provide a "crunch"  The  bread is the only source of carbohydrate in a sandwich and  can be decreased by trying some of these alternatives to traditional loaf bread  Joseph's makes a pita bread and a flat bread that are 50 cal and 4 net carbs available at BJs and WalMart.  This can be toasted to use with hummous as well  Toufayan makes a low carb flatbread that's 100 cal and 9 net carbs available at [employer] and [employer] makes 2 sizes of  Low carb whole wheat tortilla  (The large one is 210 cal and 6 net carbs) Avoid "Low fat dressings, as well as Reyne Dumas and [address] dressings They are loaded with sugar!   3 PM/ Mid day  Snack:  Consider  1 ounce of  almonds, walnuts, pistachios, pecans, peanuts,  Macadamia nuts or a nut medley.  Avoid "granola"; the dried cranberries and raisins are loaded with carbohydrates. Mixed nuts as long as there are no raisins,  cranberries or dried fruit.     6 PM  Dinner:     Meat/fowl/fish with a green salad, and either broccoli, cauliflower, green beans, spinach, brussel sprouts or  Lima beans. DO NOT BREAD THE PROTEIN!!      There is a low carb pasta by Dreamfield's that is acceptable  and tastes great: only 5 digestible carbs/serving.( All grocery stores but BJs carry it )  Try Kai Levins Angelo's chicken piccata or chicken or eggplant parm over low carb pasta.(Lowes and BJs)   Clifton Custard Sanchez's "Carnitas" (pulled pork, no sauce,  0 carbs) or his beef pot roast to make a dinner burrito (at BJ's)  Pesto over low carb pasta (bj's sells a good quality pesto in the center refrigerated section of the deli   Whole wheat pasta is still full of digestible carbs and  Not as low in glycemic index as Dreamfield's.   Brown rice is still rice,  So skip the rice and noodles if you eat [place] or [place] (or at least limit to 1/2 cup)  9 PM snack :   Breyer's "low carb" fudgsicle or  ice cream bar (Carb Smart line), or  Weight Watcher's ice cream bar , or another "no sugar added" ice cream;  a  serving of fresh berries/cherries with whipped cream   Cheese or DANNON'S LlGHT N FIT [place] YOGURT  Avoid bananas, pineapple, grapes  and watermelon on a regular basis because they are high in sugar.  THINK OF THEM AS DESSERT  Remember that snack Substitutions should be less than 10 NET carbs per serving and meals < 20 carbs. Remember to subtract fiber grams to get the "net carbs."

## 2013-05-23 NOTE — Progress Notes (Signed)
Patient ID: Jade Edwards, female   DOB: 1955-12-05, 57 y.o.   MRN: [id number]    Patient Active Problem List   Diagnosis Date Noted  . Achilles tendinosis 11/18/2012  . Routine general medical examination at a health care facility 11/18/2012  . Varicose veins of right lower extremity with pain 11/18/2012  . Chronic back pain greater than 3 months duration 09/23/2012  . Morbid obesity with BMI of 40.0-44.9, adult 11/14/2011  . 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 Follow-up    medication refills    HPI:   Jade Edwards a 57 y.o. female who presents for 6 month follow up on ADD,  Obesity. Etc has gained another 5 lbs in 6 months, but has lost 6 lbs in the last several weeks.  Not exercising or sleeping well.   BMI is  now 44.   She does not exercise or watch her diet due to her responsibilities caring for her large family.  Her husband cancer is in remission and he has gained 30 pounds back. She and husband are now trying to correct time for themselves. She is retired Engineer, civil (consulting). She has been able to lose a significant amount of weight in the past as much as 44 pounds. She has a history of a torn Achilles tendon this does tend to complicate her ability to go for long walks. 1 fostering and 3 adopted,  Ages 49, 13,  78 and  10.  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   Has lost 44 lbs in the past.  History of torn Achilles tendon  Venous insufficiency right greater than left,    Diet reviewed:  oatmeal in the am   Yogurt in the lunch hours.  Apple in the afternoon.  doinner is grilled /baked chicken, a green ,  No starch .     History reviewed. No pertinent past medical history.  History reviewed. No pertinent past surgical history.     The following portions of the patient's history were reviewed and updated as appropriate: Allergies, current medications, and problem list.    Review of  Systems:   12 Pt  review of systems was negative except those addressed in the HPI,     History   Social History  . Marital Status: Single    Spouse Name: N/A    Number of Children: N/A  . Years of Education: N/A   Occupational History  . Not on file.   Social History Main Topics  . Smoking status: Never Smoker   . Smokeless tobacco: Never Used  . Alcohol Use: No  . Drug Use: No  . Sexual Activity: Not on file   Other Topics Concern  . Not on file   Social History Narrative  . No narrative on file    Objective:  Filed Vitals:   05/23/13 0940  BP: 128/78  Pulse: 95  Temp: 97.6 F (36.4 C)  Resp: 14     General appearance: alert, cooperative and appears stated age Ears: normal TM's and external ear canals both ears Throat: lips, mucosa, and tongue normal; teeth and gums normal Neck: no adenopathy, no carotid bruit, supple, symmetrical, trachea midline and thyroid not enlarged, symmetric, no tenderness/mass/nodules Back: symmetric, no curvature. ROM normal. No CVA tenderness. Lungs: clear to auscultation bilaterally Heart: regular rate and rhythm, S1, S2 normal, no murmur, click, rub or gallop Abdomen: soft, non-tender; bowel sounds normal;  no masses,  no organomegaly Pulses: 2+ and symmetric Skin: Skin color, texture, turgor normal. No rashes or lesions Lymph nodes: Cervical, supraclavicular, and axillary nodes normal.  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 I discussed increasing her Adderall to 40 mg twice daily. She will return in one month. If her symptoms not well-controlled on this regimen her next consideration would be  A change to Adderall LA.  Morbid obesity with BMI of 40.0-44.9, adult I have addressed  BMI and lack of concern. She noticed that she's been distracted by the possibilities of caring for 3 small children  as well as her husband's recent recovery from metastatic cancer. I have recommended wt loss of 10% of body weight over the next 6 months using a low glycemic index diet and regular exercise a minimum of 5 days per week.     Updated Medication List Outpatient Encounter Prescriptions as of 05/23/2013  Medication Sig Dispense Refill  . amphetamine-dextroamphetamine (ADDERALL) 20 MG tablet Take two tablets in the morning and one tablet in the afternoon.  90 tablet  0  . ibuprofen (ADVIL,MOTRIN) 800 MG tablet Take 1 tablet (800 mg total) by mouth every 8 (eight) hours as needed for pain.  30 tablet  0  . amphetamine-dextroamphetamine (ADDERALL) 20 MG tablet Take 2 tablets (40 mg total) by mouth 2 (two) times daily.  120 tablet  0  . [DISCONTINUED] methocarbamol (ROBAXIN) 500 MG tablet Take 1 tablet (500 mg total) by mouth 3 (three) times daily.  30 tablet  2  . [DISCONTINUED] oxyCODONE-acetaminophen (ROXICET) 5-325 MG per tablet Take 1 tablet by mouth every 8 (eight) hours as needed for pain.  20 tablet  0  . [DISCONTINUED] pantoprazole (PROTONIX) 40 MG tablet Take 1 tablet (40 mg total) by mouth daily.  30 tablet  3  . [DISCONTINUED] traMADol (ULTRAM) 50 MG tablet Take 1 tablet (50 mg total) by mouth every 6 (six) hours as needed for pain.  120 tablet  3   No facility-administered encounter medications on file as of 05/23/2013.     Orders Placed This Encounter  Procedures  . Flu Vaccine QUAD 36+ mos PF IM (Fluarix)  . Tdap vaccine greater than or equal to 7yo IM  . Vitamin B12    Return in about 4 weeks (around 06/20/2013).

## 2013-05-25 ENCOUNTER — Encounter: Payer: Self-pay | Admitting: Internal Medicine

## 2013-05-25 NOTE — Assessment & Plan Note (Signed)
I have addressed  BMI and lack of concern. She noticed that she's been distracted by the possibilities of caring for 3 small children as well as her husband's recent recovery from metastatic cancer. I have recommended wt loss of 10% of body weight over the next 6 months using a low glycemic index diet and regular exercise a minimum of 5 days per week.

## 2013-05-25 NOTE — Assessment & Plan Note (Signed)
She's having difficulty concentrating by the end of the day and would like her dose adjusted. I discussed increasing her Adderall to 40 mg twice daily. She will return in one month. If her symptoms not well-controlled on this regimen her next consideration would be  A change to Adderall LA.

## 2013-05-27 ENCOUNTER — Encounter: Payer: Self-pay | Admitting: *Deleted

## 2013-06-09 ENCOUNTER — Encounter: Payer: Self-pay | Admitting: Internal Medicine

## 2013-07-10 ENCOUNTER — Other Ambulatory Visit: Payer: Self-pay | Admitting: Internal Medicine

## 2013-07-10 NOTE — Telephone Encounter (Signed)
See below note, requesting second dose to decrease back to 20 mg due to insurance. Last visit 05/23/13

## 2013-07-10 NOTE — Telephone Encounter (Signed)
Adderall rx needed. She usually takes 40 mg in a.m. And 20 mg at lunch.  States Dr. Darrick Huntsman changed dose to 40 mg a.m. And 40 mg at lunch at last visit.  Pt would like to lower it back down to 40 mg in a.m. And 20 mg at lunch because her insurance will only allow 60 mg a day.  Usually gets 2 months at a time.

## 2013-07-11 MED ORDER — AMPHETAMINE-DEXTROAMPHETAMINE 20 MG PO TABS: ORAL_TABLET | ORAL | Status: DC

## 2013-07-11 NOTE — Addendum Note (Signed)
Addended by: Sherlene Shams on: 07/11/2013 05:40 PM   Modules accepted: Orders

## 2013-07-11 NOTE — Telephone Encounter (Signed)
Ok to refill,  printed rx  For 2 months

## 2013-07-14 ENCOUNTER — Other Ambulatory Visit: Payer: Self-pay | Admitting: Internal Medicine

## 2013-07-14 ENCOUNTER — Telehealth: Payer: Self-pay | Admitting: Internal Medicine

## 2013-07-14 NOTE — Telephone Encounter (Signed)
Last visit 05/23/13, refill? 

## 2013-07-14 NOTE — Telephone Encounter (Signed)
The patient is wanting a refill on her tramadol for leg pain

## 2013-07-15 NOTE — Telephone Encounter (Signed)
See refill note, refill approved.

## 2013-09-09 ENCOUNTER — Telehealth: Payer: Self-pay | Admitting: Internal Medicine

## 2013-09-09 DIAGNOSIS — F909 Attention-deficit hyperactivity disorder, unspecified type: Secondary | ICD-10-CM

## 2013-09-09 NOTE — Telephone Encounter (Signed)
Pt states she needs refill on adderall.  States she usually gets 2 months at a time.  CPE scheduled 11/19/13.

## 2013-09-11 NOTE — Telephone Encounter (Signed)
Last OV 10/14 and last refill 07/11/13 for 90 tablets and 05/23/13 for a 120 tablets okay to fill?

## 2013-09-12 MED ORDER — AMPHETAMINE-DEXTROAMPHETAMINE 20 MG PO TABS: ORAL_TABLET | ORAL | Status: DC

## 2013-09-12 NOTE — Telephone Encounter (Signed)
Patient notified

## 2013-09-12 NOTE — Telephone Encounter (Signed)
Patient stated she called in December and notified her insurance would not pay for the increased dose, patient prefers to stay on same dose of adderall 20 mg take two tablets in the morning and one in the afternoon. Is okay to fill at this dose and advised patient she needs an appointment patient stated this is "unfair I have an appointment in April which is 6 months from last visit please advise.

## 2013-09-12 NOTE — Telephone Encounter (Signed)
She was supposed to return after her October visit when we  increased her dose of Adderall to 40 mg twice daily. Because this is a controlled substance ,  I cannot refill without a repeat visit.  You can refill for one month to give her time to make an appointment. 40 mg twice daily

## 2013-09-12 NOTE — Telephone Encounter (Signed)
Scripts placed up front for pick up.

## 2013-09-12 NOTE — Telephone Encounter (Signed)
Does not need another appt.  If she stayed on prior dose

## 2013-09-12 NOTE — Assessment & Plan Note (Signed)
She was instructed to return after her dose adjustment in October. I cannot refill  until she returns.

## 2013-11-13 ENCOUNTER — Telehealth: Payer: Self-pay | Admitting: Internal Medicine

## 2013-11-13 MED ORDER — AMPHETAMINE-DEXTROAMPHETAMINE 20 MG PO TABS: ORAL_TABLET | ORAL | Status: DC

## 2013-11-13 NOTE — Telephone Encounter (Signed)
amphetamine-dextroamphetamine (ADDERALL) 20 MG tablet

## 2013-11-13 NOTE — Telephone Encounter (Signed)
Last fill 09/12/13 for ninety ok to fill.

## 2013-11-14 NOTE — Telephone Encounter (Signed)
Patient notified by voicemail script ready for pick up and placed up font.

## 2013-11-16 ENCOUNTER — Telehealth: Payer: Self-pay | Admitting: Internal Medicine

## 2013-11-19 ENCOUNTER — Encounter: Payer: BC Managed Care – PPO | Admitting: Internal Medicine

## 2013-12-17 ENCOUNTER — Ambulatory Visit (INDEPENDENT_AMBULATORY_CARE_PROVIDER_SITE_OTHER): Payer: BC Managed Care – PPO | Admitting: Internal Medicine

## 2013-12-17 ENCOUNTER — Encounter (INDEPENDENT_AMBULATORY_CARE_PROVIDER_SITE_OTHER): Payer: Self-pay

## 2013-12-17 ENCOUNTER — Encounter: Payer: BC Managed Care – PPO | Admitting: Internal Medicine

## 2013-12-17 ENCOUNTER — Encounter: Payer: Self-pay | Admitting: Internal Medicine

## 2013-12-17 VITALS — BP 142/82 | HR 87 | Temp 97.8°F | Resp 16 | Ht 61.25 in | Wt 236.5 lb

## 2013-12-17 DIAGNOSIS — Z1239 Encounter for other screening for malignant neoplasm of breast: Secondary | ICD-10-CM

## 2013-12-17 DIAGNOSIS — R5381 Other malaise: Secondary | ICD-10-CM

## 2013-12-17 DIAGNOSIS — R5383 Other fatigue: Principal | ICD-10-CM

## 2013-12-17 DIAGNOSIS — F909 Attention-deficit hyperactivity disorder, unspecified type: Secondary | ICD-10-CM

## 2013-12-17 DIAGNOSIS — Z6841 Body Mass Index (BMI) 40.0 and over, adult: Secondary | ICD-10-CM

## 2013-12-17 DIAGNOSIS — Z0000 Encounter for general adult medical examination without abnormal findings: Secondary | ICD-10-CM

## 2013-12-17 LAB — COMPREHENSIVE METABOLIC PANEL
ALK PHOS: 103 U/L (ref 39–117)
ALT: 20 U/L (ref 0–35)
AST: 23 U/L (ref 0–37)
Albumin: 3.9 g/dL (ref 3.5–5.2)
BUN: 15 mg/dL (ref 6–23)
CO2: 28 mEq/L (ref 19–32)
Calcium: 9.2 mg/dL (ref 8.4–10.5)
Chloride: 103 mEq/L (ref 96–112)
Creatinine, Ser: 0.9 mg/dL (ref 0.4–1.2)
GFR: 73.51 mL/min (ref >60.00)
Glucose, Bld: 93 mg/dL (ref 70–99)
Potassium: 4.3 mEq/L (ref 3.5–5.1)
SODIUM: 138 meq/L (ref 135–145)
Total Bilirubin: 0.5 mg/dL (ref 0.3–1.2)
Total Protein: 7.2 g/dL (ref 6.0–8.3)

## 2013-12-17 LAB — TSH: TSH: 1.45 u[IU]/mL (ref 0.35–5.50)

## 2013-12-17 LAB — CBC WITH DIFFERENTIAL/PLATELET
BASOS ABS: 0 10*3/uL (ref 0.0–0.1)
Basophils Relative: 0.3 % (ref 0.0–3.0)
EOS ABS: 0.5 10*3/uL (ref 0.0–0.7)
Eosinophils Relative: 7 % — ABNORMAL HIGH (ref 0.0–5.0)
HCT: 41 % (ref 36.0–46.0)
Hemoglobin: 13.8 g/dL (ref 12.0–15.0)
LYMPHS PCT: 30.5 % (ref 12.0–46.0)
Lymphs Abs: 2.2 10*3/uL (ref 0.7–4.0)
MCHC: 33.6 g/dL (ref 30.0–36.0)
MCV: 84.8 fl (ref 78.0–100.0)
Monocytes Absolute: 0.3 10*3/uL (ref 0.1–1.0)
Monocytes Relative: 4.7 % (ref 3.0–12.0)
NEUTROS ABS: 4.1 10*3/uL (ref 1.4–7.7)
Neutrophils Relative %: 57.5 % (ref 43.0–77.0)
Platelets: 280 10*3/uL (ref 150.0–400.0)
RBC: 4.83 Mil/uL (ref 3.87–5.11)
RDW: 14.4 % (ref 11.5–14.6)
WBC: 7.2 10*3/uL (ref 4.5–10.5)

## 2013-12-17 LAB — VITAMIN B12: Vitamin B-12: 261 pg/mL (ref 211–911)

## 2013-12-17 MED ORDER — PHENTERMINE HCL 37.5 MG PO TABS: ORAL_TABLET | ORAL | Status: DC

## 2013-12-17 NOTE — Patient Instructions (Signed)
You MUST LOSE 23 LBS OVER THE NEXT 6 MONTHS WITH DIET AND EXERCISE  IF YOUR HUSBAND DOES NOT SUPPORT YOUR EFFORTS, PLEASE ASK HIM TO SEE ME  YOUR HEALTH IS IN JEOPARDY BECAUSE YOU ARE MORBIDLY OBESE  I am prescribing phentermine for a month to take INSTEAD of adderall to suppress your appetite  I still want you eating a low carb diet  This is  One version of a  "Low GI"  Diet:  It will still lower your blood sugars and allow you to lose 4 to 8  lbs  per month if you follow it carefully.  Your goal with exercise is a minimum of 30 minutes of aerobic exercise 5 days per week (Walking does not count once it becomes easy!)    All of the foods can be found at grocery stores and in bulk at [employer].  The Atkins protein bars and shakes are available in more varieties at Target, WalMart and [employer]'s [employer].     7 AM Breakfast:  Choose from the following:  Low carbohydrate Protein  Shakes (I recommend the EAS AdvantEdge "Carb Control" shakes  Or the low carb shakes by Atkins.    2.5 carbs   Arnold's "Sandwhich Thin"toasted  w/ peanut butter (no jelly: about 20 net carbs  "Bagel Thin" with cream cheese and salmon: about 20 carbs   a scrambled egg/bacon/cheese burrito made with Mission's "carb balance" whole wheat tortilla  (about 10 net carbs )   Avoid cereal and bananas, oatmeal and cream of wheat and grits. They are loaded with carbohydrates!   10 AM: high protein snack  Protein bar by Atkins (the snack size, under 200 cal, usually < 6 net carbs).    A stick of cheese:  Around 1 carb,  100 cal     Dannon Light n Fit [place] Yogurt  (80 cal, 8 carbs)  Other so called "protein bars" and Greek yogurts tend to be loaded with carbohydrates.  Remember, in food advertising, the word "energy" is synonymous for " carbohydrate."  Lunch:   A Sandwich using the bread choices listed, Can use any  Eggs,  lunchmeat, grilled meat or canned tuna), avocado, regular mayo/mustard  and cheese.  A Salad using blue  cheese, ranch,  Goddess or vinagrette,  No croutons or "confetti" and no "candied nuts" but regular nuts OK.   No pretzels or chips.  Pickles and miniature sweet peppers are a good low carb alternative that provide a "crunch"  The bread is the only source of carbohydrate in a sandwich and  can be decreased by trying some of these alternatives to traditional loaf bread  Joseph's makes a pita bread and a flat bread that are 50 cal and 4 net carbs available at BJs and WalMart.  This can be toasted to use with hummous as well  Toufayan makes a low carb flatbread that's 100 cal and 9 net carbs available at [employer] and [employer]  Mission makes 2 sizes of  Low carb whole wheat tortilla  (The large one is 210 cal and 6 net carbs) Avoid "Low fat dressings, as well as Reyne DumasCatalina and [address] dressings They are loaded with sugar!   3 PM/ Mid day  Snack:  Consider  1 ounce of  almonds, walnuts, pistachios, pecans, peanuts,  Macadamia nuts or a nut medley.  Avoid "granola"; the dried cranberries and raisins are loaded with carbohydrates. Mixed nuts as long as there are no raisins,  cranberries  or dried fruit.     6 PM  Dinner:     Meat/fowl/fish with a green salad, and either broccoli, cauliflower, green beans, spinach, brussel sprouts or  Lima beans. DO NOT BREAD THE PROTEIN!!      There is a low carb pasta by Dreamfield's that is acceptable and tastes great: only 5 digestible carbs/serving.( All grocery stores but BJs carry it )  Try Kai LevinsMichel Angelo's chicken piccata or chicken or eggplant parm over low carb pasta.(Lowes and BJs)   Clifton CustardAaron Sanchez's "Carnitas" (pulled pork, no sauce,  0 carbs) or his beef pot roast to make a dinner burrito (at BJ's)  Pesto over low carb pasta (bj's sells a good quality pesto in the center refrigerated section of the deli   Whole wheat pasta is still full of digestible carbs and  Not as low in glycemic index as Dreamfield's.   Brown rice is still rice,  So skip the rice and  noodles if you eat Congohinese or [place]hai (or at least limit to 1/2 cup)  9 PM snack :   Breyer's "low carb" fudgsicle or  ice cream bar (Carb Smart line), or  Weight Watcher's ice cream bar , or another "no sugar added" ice cream;  a serving of fresh berries/cherries with whipped cream   Cheese or DANNON'S LlGHT N FIT GREEK YOGURT  Avoid bananas, pineapple, grapes  and watermelon on a regular basis because they are high in sugar.  THINK OF THEM AS DESSERT  Remember that snack Substitutions should be less than 10 NET carbs per serving and meals < 20 carbs. Remember to subtract fiber grams to get the "net carbs."

## 2013-12-17 NOTE — Progress Notes (Signed)
Pre-visit discussion using our clinic review tool. No additional management support is needed unless otherwise documented below in the visit note.  

## 2013-12-18 ENCOUNTER — Encounter: Payer: Self-pay | Admitting: *Deleted

## 2013-12-18 DIAGNOSIS — Z0000 Encounter for general adult medical examination without abnormal findings: Secondary | ICD-10-CM | POA: Insufficient documentation
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
## 2013-12-18 NOTE — Assessment & Plan Note (Signed)
Annual comprehensive exam was done including breast, excluding pelvic and PAP smear. All screenings have been addressed .  
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
## 2013-12-18 NOTE — Assessment & Plan Note (Signed)
Managed chronically with adderall, now suspended for trial of phentermine

## 2013-12-25 ENCOUNTER — Encounter: Payer: Self-pay | Admitting: *Deleted

## 2014-01-02 ENCOUNTER — Ambulatory Visit (INDEPENDENT_AMBULATORY_CARE_PROVIDER_SITE_OTHER): Payer: BC Managed Care – PPO | Admitting: Internal Medicine

## 2014-01-02 ENCOUNTER — Encounter: Payer: Self-pay | Admitting: Internal Medicine

## 2014-01-02 VITALS — BP 146/78 | HR 72 | Temp 97.6°F | Resp 16 | Wt 232.2 lb

## 2014-01-02 DIAGNOSIS — Z6841 Body Mass Index (BMI) 40.0 and over, adult: Secondary | ICD-10-CM

## 2014-01-02 DIAGNOSIS — F909 Attention-deficit hyperactivity disorder, unspecified type: Secondary | ICD-10-CM

## 2014-01-02 MED ORDER — PHENTERMINE HCL 37.5 MG PO TABS: ORAL_TABLET | ORAL | Status: DC

## 2014-01-02 MED ORDER — ADDERALL 20 MG PO TABS: ORAL_TABLET | ORAL | Status: DC

## 2014-01-02 NOTE — Patient Instructions (Addendum)
You are doing GREAT!!!!!  Rosanna RandyBlue Diamond makes an Almond/coconut milk unsweetened  45 cal 1 carb  Fiorucci's makes a  prosciutto wrapped mozzarella  Stick that is low carb and very satisfying   We are resuming adderall at 20 mg twice daily   Return in two weeks

## 2014-01-02 NOTE — Progress Notes (Signed)
Patient ID: Jade Edwards, female   DOB: [birth year], 58 y.o.   MRN: [id number]   Patient Active Problem List   Diagnosis Date Noted  . Visit for preventive health examination 12/18/2013  . Achilles tendinosis 11/18/2012  . Routine general medical examination at a health care facility 11/18/2012  . Varicose veins of right lower extremity with pain 11/18/2012  . Chronic back pain greater than 3 months duration 09/23/2012  . Morbid obesity with BMI of 40.0-44.9, adult 11/14/2011  . 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 Follow-up    Phentermine     HPI:   Jade HammockCynthia Edwards is a 58 y.o. female who presents for Follow up on initiation of phentermine therapy started two weeks ago for BMI > 40.  She has had a weight loss of 4 lbs in the last 2 weeks noted .  Bp and pulse normal.  Ha a long talk with husband about his lack of support and direct interference in the past with her attempts to lose weight.   Tolerating medication but not tolerating suspension of adderall.  Feels fuzzy headed in the afternoon and not focussed.   Discussed resuming it a lower dose    No past medical history on file.  No past surgical history on file.     The following portions of the patient's history were reviewed and updated as appropriate: Allergies, current medications, and problem list.    Review of Systems:   Patient denies headache, fevers, malaise, unintentional weight loss, skin rash, eye pain, sinus congestion and sinus pain, sore throat, dysphagia,  hemoptysis , cough, dyspnea, wheezing, chest pain, palpitations, orthopnea, edema, abdominal pain, nausea, melena, diarrhea, constipation, flank pain, dysuria, hematuria, urinary  Frequency, nocturia, numbness, tingling, seizures,  Focal weakness, Loss of consciousness,  Tremor, insomnia, depression, anxiety, and suicidal ideation.     History   Social  History  . Marital Status: Single    Spouse Name: N/A    Number of Children: N/A  . Years of Education: N/A   Occupational History  . Not on file.   Social History Main Topics  . Smoking status: Never Smoker   . Smokeless tobacco: Never Used  . Alcohol Use: No  . Drug Use: No  . Sexual Activity: Not on file   Other Topics Concern  . Not on file   Social History Narrative  . No narrative on file    Objective:  Filed Vitals:   01/02/14 0911  BP: 146/78  Pulse: 72  Temp: 97.6 F (36.4 C)  Resp: 16     General appearance: alert, cooperative and appears stated age Ears: normal TM's and external ear canals both ears Throat: lips, mucosa, and tongue normal; teeth and gums normal Neck: no adenopathy, no carotid bruit, supple, symmetrical, trachea midline and thyroid not enlarged, symmetric, no tenderness/mass/nodules Back: symmetric, no curvature. ROM normal. No CVA tenderness. Lungs: clear to auscultation bilaterally Heart: regular rate and rhythm, S1, S2 normal, no murmur, click, rub or gallop Abdomen: soft, non-tender; bowel sounds normal; no masses,  no organomegaly Pulses: 2+ and symmetric Skin: Skin color, texture, turgor normal. No rashes or lesions Lymph nodes: Cervical, supraclavicular, and axillary nodes normal.  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  We will continue it for three months.     Updated Medication List Outpatient Encounter Prescriptions as of 01/02/2014  Medication Sig  . phentermine (ADIPEX-P) 37.5 MG tablet 1/2 tablet two times daily before breakfast and dinner  . [DISCONTINUED] phentermine (ADIPEX-P) 37.5 MG tablet 1/2 tablet two times daily before breakfast and dinner  . ADDERALL 20 MG tablet  Take two tablets in the morning and one tablet in the afternoon.  Marland Kitchen. ibuprofen (ADVIL,MOTRIN) 800 MG tablet Take 1 tablet (800 mg total) by mouth every 8 (eight) hours as needed for pain.  . traMADol (ULTRAM) 50 MG tablet TAKE 1 TABLET EVERY 6 HOURS AS NEEDED FOR PAIN     No orders of the defined types were placed in this encounter.    Return in about 2 weeks (around 01/16/2014).

## 2014-01-02 NOTE — Progress Notes (Signed)
Pre-visit discussion using our clinic review tool. No additional management support is needed unless otherwise documented below in the visit note.  

## 2014-01-04 NOTE — Assessment & Plan Note (Signed)
She has lost 4 lbs in 2 weeks with initiation of phentermine.  We will continue it for three months.

## 2014-01-04 NOTE — Assessment & Plan Note (Signed)
resuming Adderall at a reduced dose given concurrent use of phentermine and decreased concentration    

## 2014-01-16 ENCOUNTER — Ambulatory Visit: Payer: BC Managed Care – PPO | Admitting: Internal Medicine

## 2014-01-21 ENCOUNTER — Encounter: Payer: Self-pay | Admitting: Internal Medicine

## 2014-01-23 ENCOUNTER — Ambulatory Visit: Payer: BC Managed Care – PPO | Admitting: Internal Medicine

## 2014-01-28 ENCOUNTER — Ambulatory Visit: Payer: BC Managed Care – PPO | Admitting: Internal Medicine

## 2014-02-19 ENCOUNTER — Ambulatory Visit (INDEPENDENT_AMBULATORY_CARE_PROVIDER_SITE_OTHER): Payer: BC Managed Care – PPO | Admitting: Internal Medicine

## 2014-02-19 ENCOUNTER — Encounter: Payer: Self-pay | Admitting: Internal Medicine

## 2014-02-19 VITALS — BP 132/72 | HR 78 | Resp 14 | Ht 61.25 in | Wt 228.5 lb

## 2014-02-19 DIAGNOSIS — F909 Attention-deficit hyperactivity disorder, unspecified type: Secondary | ICD-10-CM

## 2014-02-19 DIAGNOSIS — Z6841 Body Mass Index (BMI) 40.0 and over, adult: Secondary | ICD-10-CM

## 2014-02-19 MED ORDER — PHENTERMINE HCL 37.5 MG PO TABS: ORAL_TABLET | ORAL | Status: DC

## 2014-02-19 MED ORDER — AMPHETAMINE-DEXTROAMPHETAMINE 20 MG PO TABS: ORAL_TABLET | ORAL | Status: DC

## 2014-02-19 NOTE — Patient Instructions (Signed)
If you combine a low carb with a low cal diet,  YOU WILL GAIN WEIGHT.  You will need to choose the foods that are OK on either diet:  Dannon lt n fit greek yogurt 80 cal 8 carbs  Blue diamond  almond/coconut mild unsweetened   45 cal 1 car Frittata that [employer]J's sells in the prepare food refirigerated section near the meats EAS Protein shakes or the Atkins shakes Joseph's low carb pita bread  50 cal 4 carbs      Your goal with exercise is a minimum of 30 minutes of aerobic exercise 5 days per week (Walking does not count once it becomes easy!)    All of the foods can be found at grocery stores and in bulk at [employer].  The Atkins protein bars and shakes are available in more varieties at Target, WalMart and [employer].     7 AM Breakfast:  Choose from the following:  Low carbohydrate Protein  Shakes (I recommend the EAS AdvantEdge "Carb Control" shakes  Or the low carb shakes by Atkins.    2.5 carbs   Arnold's "Sandwhich Thin"toasted  w/ peanut butter (no jelly: about 20 net carbs  "Bagel Thin" with cream cheese and salmon: about 20 carbs   a scrambled egg/bacon/cheese burrito made with [employer] "carb balance" whole wheat tortilla  (about 10 net carbs )   Avoid cereal and bananas, oatmeal and cream of wheat and grits. They are loaded with carbohydrates!   10 AM: high protein snack  Protein bar by Atkins (the snack size, under 200 cal, usually < 6 net carbs).    A stick of cheese:  Around 1 carb,  100 cal     Dannon Light n Fit [place] Yogurt  (80 cal, 8 carbs)  Other so called "protein bars" and Greek yogurts tend to be loaded with carbohydrates.  Remember, in food advertising, the word "energy" is synonymous for " carbohydrate."  Lunch:   A Sandwich using the bread choices listed, Can use any  Eggs,  lunchmeat, grilled meat or canned tuna), avocado, regular mayo/mustard  and cheese.  A Salad using blue cheese, ranch,  Goddess or vinagrette,  No croutons or "confetti" and no "candied  nuts" but regular nuts OK.   No pretzels or chips.  Pickles and miniature sweet peppers are a good low carb alternative that provide a "crunch"  The bread is the only source of carbohydrate in a sandwich and  can be decreased by trying some of these alternatives to traditional loaf bread  Joseph's makes a pita bread and a flat bread that are 50 cal and 4 net carbs available at BJs and [employer].  This can be toasted to use with hummous as well  Toufayan makes a low carb flatbread that's 100 cal and 9 net carbs available at [employer] and [employer]  [employer]ission makes 2 sizes of  Low carb whole wheat tortilla  (The large one is 210 cal and 6 net carbs) Avoid "Low fat dressings, as well as Reyne DumasCatalina and [address] dressings They are loaded with sugar!   3 PM/ Mid day  Snack:  Consider  1 ounce of  almonds, walnuts, pistachios, pecans, peanuts,  Macadamia nuts or a nut medley.  Avoid "granola"; the dried cranberries and raisins are loaded with carbohydrates. Mixed nuts as long as there are no raisins,  cranberries or dried fruit.    Try the prosciutto/mozzarella cheese sticks by Fiorruci  In deli /backery section  High protein      6 PM  Dinner:     Meat/fowl/fish with a green salad, and either broccoli, cauliflower, green beans, spinach, brussel sprouts or  Lima beans. DO NOT BREAD THE PROTEIN!!      There is a low carb pasta by Dreamfield's that is acceptable and tastes great: only 5 digestible carbs/serving.( All grocery stores but BJs carry it )  Try Kai LevinsMichel Angelo's chicken piccata or chicken or eggplant parm over low carb pasta.(Lowes and BJs)   Clifton CustardAaron Sanchez's "Carnitas" (pulled pork, no sauce,  0 carbs) or his beef pot roast to make a dinner burrito (at BJ's)  Pesto over low carb pasta (bj's sells a good quality pesto in the center refrigerated section of the deli   Try satueeing  Roosvelt HarpsBok Choy with mushroooms  Whole wheat pasta is still full of digestible carbs and  Not as low in glycemic index as  Dreamfield's.   Brown rice is still rice,  So skip the rice and noodles if you eat Cong[place]inese or [place] (or at least limit to 1/2 cup)  9 PM snack :   Breyer's "low carb" fudgsicle or  ice cream bar (Carb Smart line), or  Weight Watcher's ice cream bar , or another "no sugar added" ice cream;  a serving of fresh berries/cherries with whipped cream   Cheese or DANNON'S LlGHT N FIT GREEK YOGURT  8 ounces of Blue Diamond unsweetened almond/cococunut milk    Avoid bananas, pineapple, grapes  and watermelon on a regular basis because they are high in sugar.  THINK OF THEM AS DESSERT  Remember that snack Substitutions should be less than 10 NET carbs per serving and meals < 20 carbs. Remember to subtract fiber grams to get the "net carbs."

## 2014-02-19 NOTE — Progress Notes (Signed)
Pre visit review using our clinic review tool, if applicable. No additional management support is needed unless otherwise documented below in the visit note. 

## 2014-02-19 NOTE — Assessment & Plan Note (Addendum)
4 lb wt loss since last visit, which was discussed with patient length today as an indequate  response to medication . She is not exercising enough and has not been following a diet plan.  .She is aware of the possible side effects and risks and understands that  the medication will be discontinued if she has not lost 5% of her body weight over the next 3 months, which , based on today's weight is 12 lbs.. Reiterated the need to follow a low glycemic index diet and participate in aerobic exercise a minimum of 30 minutes 5 days weekly

## 2014-02-22 NOTE — Assessment & Plan Note (Signed)
resuming Adderall at a reduced dose given concurrent use of phentermine and decreased concentration

## 2014-02-22 NOTE — Progress Notes (Signed)
Patient ID: Jade Edwards, female   DOB: 1955/09/24, 58 y.o.   MRN: [id number]   Patient Active Problem List   Diagnosis Date Noted  . Visit for preventive health examination 12/18/2013  . Achilles tendinosis 11/18/2012  . Routine general medical examination at a health care facility 11/18/2012  . Varicose veins of right lower extremity with pain 11/18/2012  . Chronic back pain greater than 3 months duration 09/23/2012  . Morbid obesity with BMI of 40.0-44.9, adult 11/14/2011  . 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 Follow-up    2 week followup Phentermine    HPI:   Jade LatusCynthia D Edwards is a 58 y.o. female who presents for follow up on pharmaceutical therapy for morbid obesity and ADD.  She has been taking phentermine 37.5 mg daily since May 1 and resumed a a reduced dose of adderall since mid May due to increased difficulty concentrating without it. Sh eis tolerating both medications without adverse effects.  Has lost 8 lbs since she started the weight loss medication, which is less than anticipated. Patient admits to noncompliance with diet and exercise recommendations due to the responsibilities she faces daily as a mother to several young children, including fostered/adopted toddlers. Her oldest child is doing very well with weight loss, she has observed, and she is able identify differences in eating habits between her and daughter that are likely the cause of the contrasted results. She is not exercising regularly even though she has exercise equipment at home.    No past medical history on file.  No past surgical history on file.     The following portions of the patient's history were reviewed and updated as appropriate: Allergies, current medications, and problem list.    Review of Systems:   Patient denies headache, fevers, malaise, unintentional weight loss, skin rash, eye  pain, sinus congestion and sinus pain, sore throat, dysphagia,  hemoptysis , cough, dyspnea, wheezing, chest pain, palpitations, orthopnea, edema, abdominal pain, nausea, melena, diarrhea, constipation, flank pain, dysuria, hematuria, urinary  Frequency, nocturia, numbness, tingling, seizures,  Focal weakness, Loss of consciousness,  Tremor, insomnia, depression, anxiety, and suicidal ideation.     History   Social History  . Marital Status: Single    Spouse Name: N/A    Number of Children: N/A  . Years of Education: N/A   Occupational History  . Not on file.   Social History Main Topics  . Smoking status: Never Smoker   . Smokeless tobacco: Never Used  . Alcohol Use: No  . Drug Use: No  . Sexual Activity: Not on file   Other Topics Concern  . Not on file   Social History Narrative  . No narrative on file    Objective:  Filed Vitals:   02/19/14 0848  BP: 132/72  Pulse: 78  Resp: 14     General appearance: alert, cooperative and appears stated age Ears: normal TM's and external ear canals both ears Throat: lips, mucosa, and tongue normal; teeth and gums normal Neck: no adenopathy, no carotid bruit, supple, symmetrical, trachea midline and thyroid not enlarged, symmetric, no tenderness/mass/nodules Back: symmetric, no curvature. ROM normal. No CVA tenderness. Lungs: clear to auscultation bilaterally Heart: regular rate and rhythm, S1, S2 normal, no murmur, click, rub or gallop Abdomen: soft, non-tender; bowel sounds normal; no masses,  no organomegaly Pulses: 2+ and symmetric Skin: Skin color, texture, turgor normal.  No rashes or lesions Lymph nodes: Cervical, supraclavicular, and axillary nodes normal.  Assessment and Plan:  Morbid obesity with BMI of 40.0-44.9, adult 4 lb wt loss since last visit, which was discussed with patient length today as an indequate  response to medication . She is not exercising enough and has not been following a diet plan.  .She is  aware of the possible side effects and risks and understands that  the medication will be discontinued if she has not lost 5% of her body weight over the next 3 months, which , based on today's weight is 12 lbs.. 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 amphetamine-dextroamphetamine (ADDERALL) 20 MG tablet Take one tablet in the morning and one tablet in the afternoon.  . phentermine (ADIPEX-P) 37.5 MG tablet 1/2 tablet two times daily before breakfast and dinner  . [DISCONTINUED] ADDERALL 20 MG tablet Take two tablets in the morning and one tablet in the afternoon.  . [DISCONTINUED] amphetamine-dextroamphetamine (ADDERALL) 20 MG tablet Take one tablet in the morning and one tablet in the afternoon.  . [DISCONTINUED] phentermine (ADIPEX-P) 37.5 MG tablet 1/2 tablet two times daily before breakfast and dinner  . [DISCONTINUED] ibuprofen (ADVIL,MOTRIN) 800 MG tablet Take 1 tablet (800 mg total) by mouth every 8 (eight) hours as needed for pain.  . [DISCONTINUED] traMADol (ULTRAM) 50 MG tablet TAKE 1 TABLET EVERY 6 HOURS AS NEEDED FOR PAIN     No orders of the defined types were placed in this encounter.    Return in about 3 weeks (around 03/12/2014).

## 2014-03-06 ENCOUNTER — Ambulatory Visit: Payer: BC Managed Care – PPO | Admitting: Internal Medicine

## 2014-03-23 ENCOUNTER — Ambulatory Visit (INDEPENDENT_AMBULATORY_CARE_PROVIDER_SITE_OTHER): Payer: BC Managed Care – PPO | Admitting: Internal Medicine

## 2014-03-23 DIAGNOSIS — Z6841 Body Mass Index (BMI) 40.0 and over, adult: Secondary | ICD-10-CM

## 2014-03-24 NOTE — Assessment & Plan Note (Signed)
She has not lost the minimum wt and no showed for appt.  Phetermine will not be refilled at this time

## 2014-03-24 NOTE — Progress Notes (Signed)
Patient ID: Marlana LatusCynthia D Molloy, female   DOB: 09-29-1955, 58 y.o.   MRN: [id number] Patient no showed for follow up appt patient was receiving phentermine for morbid obesity .  Will not refill.

## 2014-04-17 ENCOUNTER — Telehealth: Payer: Self-pay | Admitting: Internal Medicine

## 2014-04-17 NOTE — Telephone Encounter (Signed)
Patient called in states she is completely out of her Adderal and would like to know if you can refill. She does have an appointment on 05/01/14. Please advise

## 2014-04-20 MED ORDER — AMPHETAMINE-DEXTROAMPHETAMINE 20 MG PO TABS: ORAL_TABLET | ORAL | Status: DC

## 2014-04-20 NOTE — Telephone Encounter (Signed)
Please advise 

## 2014-04-20 NOTE — Telephone Encounter (Signed)
Ok to refill,  printed rx  

## 2014-04-21 NOTE — Telephone Encounter (Signed)
Patient notified script ready for pick up. Script placed at front desk. 

## 2014-05-01 ENCOUNTER — Telehealth: Payer: Self-pay | Admitting: Internal Medicine

## 2014-05-01 ENCOUNTER — Ambulatory Visit: Payer: BC Managed Care – PPO | Admitting: Internal Medicine

## 2014-05-01 NOTE — Telephone Encounter (Signed)
Pt has an appt @ 9:15 and called in at 9 and stated her child is sick and having to take her to the doctor and is needing to cancel todays appt. Cancel appt?

## 2014-05-01 NOTE — Telephone Encounter (Signed)
Yes, ok to cancel

## 2014-05-01 NOTE — Telephone Encounter (Signed)
Pt called in and is needing a refill of citalopram(CELEXA) .

## 2014-05-01 NOTE — Telephone Encounter (Signed)
Left message for pt to return my call. Med not on current or historical list

## 2014-05-21 ENCOUNTER — Other Ambulatory Visit: Payer: Self-pay

## 2014-05-21 NOTE — Telephone Encounter (Signed)
The patient called and explained she had to re-schedule her apt she was going to have tomorrow (friday, 2nd) due to the weather and her travel plans.  She has asked if Dr.Tullo could refill her adderal prescription to last until her re-scheduled apt, which is October 21st.  She stated she only has "about 3 pills left".   Thanks!

## 2014-05-22 ENCOUNTER — Ambulatory Visit: Payer: BC Managed Care – PPO | Admitting: Internal Medicine

## 2014-05-25 MED ORDER — AMPHETAMINE-DEXTROAMPHETAMINE 20 MG PO TABS: ORAL_TABLET | ORAL | Status: DC

## 2014-05-25 NOTE — Telephone Encounter (Signed)
Ok to refill, 

## 2014-05-25 NOTE — Telephone Encounter (Signed)
Left message, notifying Rx ready for pickup. Placed up front.

## 2014-06-10 ENCOUNTER — Ambulatory Visit: Payer: BC Managed Care – PPO | Admitting: Internal Medicine

## 2014-06-10 DIAGNOSIS — Z0289 Encounter for other administrative examinations: Secondary | ICD-10-CM

## 2014-11-29 DIAGNOSIS — M8580 Other specified disorders of bone density and structure, unspecified site: Secondary | ICD-10-CM | POA: Insufficient documentation

## 2014-11-29 DIAGNOSIS — M1711 Unilateral primary osteoarthritis, right knee: Secondary | ICD-10-CM | POA: Insufficient documentation

## 2015-01-12 ENCOUNTER — Telehealth: Payer: Self-pay

## 2015-01-12 NOTE — Telephone Encounter (Signed)
Attempted to call patient , home phone is not working, left a voicemail on her cell phone regarding follow up on if she has gotten her Mammogram scheduled or completed.  Left message

## 2016-10-04 DIAGNOSIS — S83289A Other tear of lateral meniscus, current injury, unspecified knee, initial encounter: Secondary | ICD-10-CM | POA: Insufficient documentation

## 2017-05-16 NOTE — Telephone Encounter (Signed)
Error

## 2017-08-08 ENCOUNTER — Ambulatory Visit
Admission: RE | Admit: 2017-08-08 | Discharge: 2017-08-08 | Disposition: A | Discharge status: Home / Self Care | Payer: 59 | Source: Ambulatory Visit | Attending: Family Medicine | Admitting: Family Medicine

## 2017-08-08 ENCOUNTER — Other Ambulatory Visit: Payer: Self-pay | Admitting: Family Medicine

## 2017-08-08 DIAGNOSIS — M79604 Pain in right leg: Secondary | ICD-10-CM | POA: Insufficient documentation

## 2018-06-20 ENCOUNTER — Other Ambulatory Visit: Payer: Self-pay | Admitting: Family Medicine

## 2018-06-20 DIAGNOSIS — Z780 Asymptomatic menopausal state: Secondary | ICD-10-CM

## 2019-05-19 ENCOUNTER — Other Ambulatory Visit: Payer: Self-pay | Admitting: Family Medicine

## 2019-05-19 DIAGNOSIS — Z780 Asymptomatic menopausal state: Secondary | ICD-10-CM

## 2019-05-19 DIAGNOSIS — Z1231 Encounter for screening mammogram for malignant neoplasm of breast: Secondary | ICD-10-CM

## 2019-07-31 DIAGNOSIS — I7300 Raynaud's syndrome without gangrene: Secondary | ICD-10-CM | POA: Insufficient documentation

## 2019-08-29 ENCOUNTER — Other Ambulatory Visit: Payer: Self-pay | Admitting: Family Medicine

## 2019-08-29 DIAGNOSIS — M958 Other specified acquired deformities of musculoskeletal system: Secondary | ICD-10-CM

## 2019-09-04 ENCOUNTER — Ambulatory Visit: Payer: Managed Care, Other (non HMO) | Attending: Family Medicine

## 2019-09-08 DIAGNOSIS — R768 Other specified abnormal immunological findings in serum: Secondary | ICD-10-CM | POA: Insufficient documentation

## 2019-09-08 DIAGNOSIS — Z8261 Family history of arthritis: Secondary | ICD-10-CM | POA: Insufficient documentation

## 2019-09-08 DIAGNOSIS — M170 Bilateral primary osteoarthritis of knee: Secondary | ICD-10-CM | POA: Insufficient documentation

## 2019-09-18 ENCOUNTER — Ambulatory Visit
Admission: RE | Admit: 2019-09-18 | Discharge: 2019-09-18 | Disposition: A | Discharge status: Home / Self Care | Payer: Managed Care, Other (non HMO) | Source: Ambulatory Visit | Attending: Family Medicine | Admitting: Family Medicine

## 2019-09-18 ENCOUNTER — Other Ambulatory Visit: Payer: Self-pay

## 2019-09-18 DIAGNOSIS — M958 Other specified acquired deformities of musculoskeletal system: Secondary | ICD-10-CM | POA: Diagnosis present

## 2019-11-23 DIAGNOSIS — R1310 Dysphagia, unspecified: Secondary | ICD-10-CM | POA: Insufficient documentation

## 2019-11-23 DIAGNOSIS — R809 Proteinuria, unspecified: Secondary | ICD-10-CM | POA: Insufficient documentation

## 2019-11-24 ENCOUNTER — Ambulatory Visit: Payer: Managed Care, Other (non HMO) | Attending: Internal Medicine

## 2019-11-24 ENCOUNTER — Other Ambulatory Visit: Payer: Self-pay

## 2019-11-24 DIAGNOSIS — Z23 Encounter for immunization: Secondary | ICD-10-CM

## 2019-11-24 NOTE — Progress Notes (Signed)
   Covid-19 Vaccination Clinic  Name:  MAELLE SHEAFFER    MRN: [id number] DOB: 13-May-1956  11/24/2019  Ms. Bilotti was observed post Covid-19 immunization for 15 minutes without incident. She was provided with Vaccine Information Sheet and instruction to access the V-Safe system.   Ms. Cuadrado was instructed to call 911 with any severe reactions post vaccine: Marland Kitchen Difficulty breathing  . Swelling of face and throat  . A fast heartbeat  . A bad rash all over body  . Dizziness and weakness   Immunizations Administered    Name Date Dose VIS Date Route   Pfizer COVID-19 Vaccine 11/24/2019  9:38 AM 0.3 mL 08/01/2019 Intramuscular   Manufacturer: [employer], [employer]   Lot: [id number]   NDC: [address]-1

## 2019-12-16 ENCOUNTER — Ambulatory Visit: Payer: Managed Care, Other (non HMO) | Attending: Internal Medicine

## 2019-12-16 DIAGNOSIS — Z23 Encounter for immunization: Secondary | ICD-10-CM

## 2019-12-16 NOTE — Progress Notes (Signed)
   Covid-19 Vaccination Clinic  Name:  Jade Edwards    MRN: [id number] DOB: February 04, 1956  12/16/2019  [name] was observed post Covid-19 immunization for 15 minutes without incident. She was provided with Vaccine Information Sheet and instruction to access the V-Safe system.   [name] was instructed to call 911 with any severe reactions post vaccine: Marland Kitchen Difficulty breathing  . Swelling of face and throat  . A fast heartbeat  . A bad rash all over body  . Dizziness and weakness   Immunizations Administered    Name Date Dose VIS Date Route   Pfizer COVID-19 Vaccine 12/16/2019  3:58 PM 0.3 mL 10/15/2018 Intramuscular   Manufacturer: [employer], [employer]   Lot: [id number]   NDC: [address]-9

## 2020-02-21 ENCOUNTER — Emergency Department
Admission: EM | Admit: 2020-02-21 | Discharge: 2020-02-21 | Disposition: A | Discharge status: Home / Self Care | Payer: Managed Care, Other (non HMO) | Attending: Emergency Medicine | Admitting: Emergency Medicine

## 2020-02-21 ENCOUNTER — Other Ambulatory Visit: Payer: Self-pay

## 2020-02-21 ENCOUNTER — Emergency Department: Payer: Managed Care, Other (non HMO)

## 2020-02-21 ENCOUNTER — Encounter
Admission: EM | Disposition: A | Discharge status: Home / Self Care | Payer: Self-pay | Source: Home / Self Care | Attending: Emergency Medicine

## 2020-02-21 ENCOUNTER — Ambulatory Visit: Admit: 2020-02-21 | Payer: Managed Care, Other (non HMO) | Admitting: Gastroenterology

## 2020-02-21 ENCOUNTER — Encounter: Payer: Self-pay | Admitting: Emergency Medicine

## 2020-02-21 ENCOUNTER — Emergency Department: Payer: Managed Care, Other (non HMO) | Admitting: Anesthesiology

## 2020-02-21 DIAGNOSIS — Z6839 Body mass index (BMI) 39.0-39.9, adult: Secondary | ICD-10-CM | POA: Diagnosis not present

## 2020-02-21 DIAGNOSIS — F909 Attention-deficit hyperactivity disorder, unspecified type: Secondary | ICD-10-CM | POA: Insufficient documentation

## 2020-02-21 DIAGNOSIS — X58XXXA Exposure to other specified factors, initial encounter: Secondary | ICD-10-CM | POA: Insufficient documentation

## 2020-02-21 DIAGNOSIS — K222 Esophageal obstruction: Secondary | ICD-10-CM | POA: Insufficient documentation

## 2020-02-21 DIAGNOSIS — K228 Other specified diseases of esophagus: Secondary | ICD-10-CM | POA: Insufficient documentation

## 2020-02-21 DIAGNOSIS — R1310 Dysphagia, unspecified: Secondary | ICD-10-CM | POA: Insufficient documentation

## 2020-02-21 DIAGNOSIS — Z20822 Contact with and (suspected) exposure to covid-19: Secondary | ICD-10-CM | POA: Insufficient documentation

## 2020-02-21 DIAGNOSIS — T18108A Unspecified foreign body in esophagus causing other injury, initial encounter: Secondary | ICD-10-CM

## 2020-02-21 HISTORY — PX: ESOPHAGOGASTRODUODENOSCOPY: SHX5428

## 2020-02-21 LAB — CBC
HCT: 47.6 % — ABNORMAL HIGH (ref 36.0–46.0)
Hemoglobin: 15.3 g/dL — ABNORMAL HIGH (ref 12.0–15.0)
MCH: 28.2 pg (ref 26.0–34.0)
MCHC: 32.1 g/dL (ref 30.0–36.0)
MCV: 87.8 fL (ref 80.0–100.0)
Platelets: 315 10*3/uL (ref 150–400)
RBC: 5.42 MIL/uL — ABNORMAL HIGH (ref 3.87–5.11)
RDW: 14.2 % (ref 11.5–15.5)
WBC: 8.1 10*3/uL (ref 4.0–10.5)
nRBC: 0 % (ref 0.0–0.2)

## 2020-02-21 LAB — COMPREHENSIVE METABOLIC PANEL
ALT: 18 U/L (ref 0–44)
AST: 26 U/L (ref 15–41)
Albumin: 4 g/dL (ref 3.5–5.0)
Alkaline Phosphatase: 116 U/L (ref 38–126)
Anion gap: 13 (ref 5–15)
BUN: 16 mg/dL (ref 8–23)
CO2: 22 mmol/L (ref 22–32)
Calcium: 8.9 mg/dL (ref 8.9–10.3)
Chloride: 103 mmol/L (ref 98–111)
Creatinine, Ser: 0.87 mg/dL (ref 0.44–1.00)
GFR calc Af Amer: >60 mL/min (ref >60)
GFR calc non Af Amer: >60 mL/min (ref >60)
Glucose, Bld: 97 mg/dL (ref 70–99)
Potassium: 4 mmol/L (ref 3.5–5.1)
Sodium: 138 mmol/L (ref 135–145)
Total Bilirubin: 0.9 mg/dL (ref 0.3–1.2)
Total Protein: 7.6 g/dL (ref 6.5–8.1)

## 2020-02-21 LAB — SARS CORONAVIRUS 2 BY RT PCR (HOSPITAL ORDER, PERFORMED IN ~~LOC~~ HOSPITAL LAB): SARS Coronavirus 2: NEGATIVE

## 2020-02-21 LAB — TROPONIN I (HIGH SENSITIVITY): Troponin I (High Sensitivity): 9 ng/L (ref <18)

## 2020-02-21 LAB — LIPASE, BLOOD: Lipase: 30 U/L (ref 11–51)

## 2020-02-21 SURGERY — EGD (ESOPHAGOGASTRODUODENOSCOPY)
Anesthesia: General

## 2020-02-21 SURGERY — ESOPHAGOGASTRODUODENOSCOPY (EGD) WITH PROPOFOL
Anesthesia: General

## 2020-02-21 MED ORDER — SODIUM CHLORIDE 0.9 % IV BOLUS
500.0000 mL | Freq: Once | INTRAVENOUS | Status: AC
  Administered 2020-02-21: 500 mL via INTRAVENOUS

## 2020-02-21 MED ORDER — GLYCOPYRROLATE 0.2 MG/ML IJ SOLN
INTRAMUSCULAR | Status: DC | PRN
  Administered 2020-02-21: .1 mg via INTRAVENOUS

## 2020-02-21 MED ORDER — SODIUM CHLORIDE 0.9 % IV SOLN
INTRAVENOUS | Status: DC
  Administered 2020-02-21: 1000 mL/h via INTRAVENOUS

## 2020-02-21 MED ORDER — PROPOFOL 10 MG/ML IV BOLUS
INTRAVENOUS | Status: AC
  Filled 2020-02-21: qty 20

## 2020-02-21 MED ORDER — FENTANYL CITRATE (PF) 100 MCG/2ML IJ SOLN
INTRAMUSCULAR | Status: DC | PRN
  Administered 2020-02-21: 50 ug via INTRAVENOUS

## 2020-02-21 MED ORDER — LIDOCAINE HCL (CARDIAC) PF 100 MG/5ML IV SOSY
PREFILLED_SYRINGE | INTRAVENOUS | Status: DC | PRN
  Administered 2020-02-21: 50 mg via INTRAVENOUS

## 2020-02-21 MED ORDER — PROPOFOL 500 MG/50ML IV EMUL
INTRAVENOUS | Status: AC
  Filled 2020-02-21: qty 50

## 2020-02-21 MED ORDER — FENTANYL CITRATE (PF) 100 MCG/2ML IJ SOLN
INTRAMUSCULAR | Status: AC
  Filled 2020-02-21: qty 2

## 2020-02-21 MED ORDER — PROPOFOL 10 MG/ML IV BOLUS
INTRAVENOUS | Status: DC | PRN
  Administered 2020-02-21: 100 mg via INTRAVENOUS

## 2020-02-21 MED ORDER — MIDAZOLAM HCL 2 MG/2ML IJ SOLN
INTRAMUSCULAR | Status: AC
  Filled 2020-02-21: qty 2

## 2020-02-21 MED ORDER — MIDAZOLAM HCL 2 MG/2ML IJ SOLN
INTRAMUSCULAR | Status: DC | PRN
  Administered 2020-02-21: 1 mg via INTRAVENOUS

## 2020-02-21 NOTE — H&P (Signed)
Midge Minium, MD [facility] [address] [place], [place] [address] Phone:[phone] Fax : [phone]  Primary Care Physician:  Rayetta Humphrey, MD Primary Gastroenterologist:  Dr. Servando Snare  Pre-Procedure History & Physical: HPI:  Jade Edwards is a 64 y.o. female is here for an endoscopy.   History reviewed. No pertinent past medical history.  Past Surgical History:  Procedure Laterality Date  . CESAREAN SECTION    . VASCULAR SURGERY      Prior to Admission medications   Medication Sig Start Date End Date Taking? Authorizing Provider  amphetamine-dextroamphetamine (ADDERALL) 20 MG tablet Take one tablet in the morning and one tablet in the afternoon. Patient not taking: Reported on 02/21/2020 05/25/14   Sherlene Shams, MD    Allergies as of 02/21/2020 - Review Complete 02/21/2020  Allergen Reaction Noted  . Codeine  11/13/2011    Family History  Problem Relation Age of Onset  . Mental illness Mother     Social History   Socioeconomic History  . Marital status: Married    Spouse name: Not on file  . Number of children: Not on file  . Years of education: Not on file  . Highest education level: Not on file  Occupational History  . Not on file  Tobacco Use  . Smoking status: Never Smoker  . Smokeless tobacco: Never Used  Substance and Sexual Activity  . Alcohol use: No  . Drug use: No  . Sexual activity: Not on file  Other Topics Concern  . Not on file  Social History Narrative  . Not on file   Social Determinants of Health   Financial Resource Strain:   . Difficulty of Paying Living Expenses:   Food Insecurity:   . Worried About Programme researcher, broadcasting/film/video in the Last Year:   . Barista in the Last Year:   Transportation Needs:   . Freight forwarder (Medical):   Marland Kitchen Lack of Transportation (Non-Medical):   Physical Activity:   . Days of Exercise per Week:   . Minutes of Exercise per Session:   Stress:   . Feeling of Stress :   Social  Connections:   . Frequency of Communication with Friends and Family:   . Frequency of Social Gatherings with Friends and Family:   . Attends Religious Services:   . Active Member of Clubs or Organizations:   . Attends Banker Meetings:   Marland Kitchen Marital Status:   Intimate Partner Violence:   . Fear of Current or Ex-Partner:   . Emotionally Abused:   Marland Kitchen Physically Abused:   . Sexually Abused:     Review of Systems: See HPI, otherwise negative ROS  Physical Exam: BP (!) 178/83   Pulse 74   Temp 97.7 F (36.5 C) (Oral)   Resp 15   Ht 5\' 3"  (1.6 m)   Wt 102.1 kg   SpO2 99%   BMI 39.86 kg/m  General:   Alert,  pleasant and cooperative in NAD Head:  Normocephalic and atraumatic. Neck:  Supple; no masses or thyromegaly. Lungs:  Clear throughout to auscultation.    Heart:  Regular rate and rhythm. Abdomen:  Soft, nontender and nondistended. Normal bowel sounds, without guarding, and without rebound.   Neurologic:  Alert and  oriented x4;  grossly normal neurologically.  Impression/Plan: Jade Edwards is here for an endoscopy to be performed for foreign body in the esophagus  Risks, benefits, limitations, and alternatives regarding  endoscopy  have been reviewed with the patient.  Questions have been answered.  All parties agreeable.   Midge Minium, MD  02/21/2020, 12:07 PM

## 2020-02-21 NOTE — Anesthesia Preprocedure Evaluation (Signed)
Anesthesia Evaluation  Patient identified by MRN, date of birth, ID band Patient awake    Reviewed: Allergy & Precautions, H&P , NPO status , Patient's Chart, lab work & pertinent test results  History of Anesthesia Complications Negative for: history of anesthetic complications  Airway Mallampati: III  TM Distance: <3 FB Neck ROM: limited    Dental  (+) Chipped   Pulmonary neg pulmonary ROS, neg shortness of breath,    Pulmonary exam normal        Cardiovascular (-) hypertensionnegative cardio ROS Normal cardiovascular exam     Neuro/Psych negative neurological ROS  negative psych ROS   GI/Hepatic negative GI ROS, Neg liver ROS, neg GERD  ,  Endo/Other  Morbid obesity  Renal/GU negative Renal ROS  negative genitourinary   Musculoskeletal   Abdominal   Peds  Hematology negative hematology ROS (+)   Anesthesia Other Findings  Past Surgical History: No date: CESAREAN SECTION No date: VASCULAR SURGERY  BMI    Body Mass Index: 39.86 kg/m      Reproductive/Obstetrics negative OB ROS                             Anesthesia Physical Anesthesia Plan  ASA: III  Anesthesia Plan: General   Post-op Pain Management:    Induction: Intravenous  PONV Risk Score and Plan: Propofol infusion and TIVA  Airway Management Planned: Natural Airway and Nasal Cannula  Additional Equipment:   Intra-op Plan:   Post-operative Plan:   Informed Consent: I have reviewed the patients History and Physical, chart, labs and discussed the procedure including the risks, benefits and alternatives for the proposed anesthesia with the patient or authorized representative who has indicated his/her understanding and acceptance.     Dental Advisory Given  Plan Discussed with: Anesthesiologist, CRNA and Surgeon  Anesthesia Plan Comments: (Patient feels that sensation of globus has resovled  Patient  consented for risks of anesthesia including but not limited to:  - adverse reactions to medications - risk of intubation if required - damage to eyes, teeth, lips or other oral mucosa - nerve damage due to positioning  - sore throat or hoarseness - Damage to heart, brain, nerves, lungs, other parts of body or loss of life  Patient voiced understanding.)        Anesthesia Quick Evaluation

## 2020-02-21 NOTE — Anesthesia Postprocedure Evaluation (Signed)
Anesthesia Post Note  Patient: Jade Edwards  Procedure(s) Performed: ESOPHAGOGASTRODUODENOSCOPY (EGD) (N/A )  Patient location during evaluation: Endoscopy Anesthesia Type: General Level of consciousness: awake and alert Pain management: pain level controlled Vital Signs Assessment: post-procedure vital signs reviewed and stable Respiratory status: spontaneous breathing, nonlabored ventilation, respiratory function stable and patient connected to nasal cannula oxygen Cardiovascular status: blood pressure returned to baseline and stable Postop Assessment: no apparent nausea or vomiting Anesthetic complications: no   No complications documented.   Last Vitals:  Vitals:   02/21/20 1255 02/21/20 1305  BP: (!) 151/88 (!) 167/73  Pulse: 78 69  Resp: 20 20  Temp:    SpO2: 100% 100%    Last Pain:  Vitals:   02/21/20 1305  TempSrc:   PainSc: 0-No pain                 Cleda Mccreedy Ashaz Robling

## 2020-02-21 NOTE — ED Triage Notes (Signed)
Pt to ED via POV for dysphagia. Pt has been having problems with swallowing on and off for the past 6 years. Over the last 3 days, pt has not been able to eat or drink anything. Pt feels like her esophagus is swollen. Pt has been vomiting mucus. Any time she tries to eat or drink anything it immediately comes back up. Pt is A & O x 4 and is in NAD.

## 2020-02-21 NOTE — ED Notes (Signed)
Pt ambulatory to toilet with steady gait noted.  

## 2020-02-21 NOTE — Op Note (Addendum)
[facility] Gastroenterology Patient Name: Jade Edwards Procedure Date: 02/21/2020 12:19 PM MRN: [id number] Account #: [id number] Date of Birth: 20-Mar-1956 Admit Type: Inpatient Age: 64 Room: [facility] ENDO ROOM 4 Gender: Female Note Status: Finalized Procedure:             Upper GI endoscopy Indications:           Dysphagia, Foreign body in the esophagus Providers:             Midge Minium MD, MD Medicines:             Propofol per Anesthesia Complications:         No immediate complications. Procedure:             Pre-Anesthesia Assessment:                        - Prior to the procedure, a History and Physical was                         performed, and patient medications and allergies were                         reviewed. The patient's tolerance of previous                         anesthesia was also reviewed. The risks and benefits                         of the procedure and the sedation options and risks                         were discussed with the patient. All questions were                         answered, and informed consent was obtained. Prior                         Anticoagulants: The patient has taken no previous                         anticoagulant or antiplatelet agents. ASA Grade                         Assessment: II - A patient with mild systemic disease.                         After reviewing the risks and benefits, the patient                         was deemed in satisfactory condition to undergo the                         procedure.                        After obtaining informed consent, the endoscope was                         passed under direct vision. Throughout the procedure,  the patient's blood pressure, pulse, and oxygen                         saturations were monitored continuously. The Endoscope                         was introduced through the mouth, and advanced to the                          second part of duodenum. The upper GI endoscopy was                         accomplished without difficulty. The patient tolerated                         the procedure well. Findings:      One benign-appearing, intrinsic moderate stenosis was found at the       gastroesophageal junction. The stenosis was traversed. A TTS dilator was       passed through the scope. Dilation with a 06-01-11 mm balloon dilator       was performed to 12 mm. The dilation site was examined following       endoscope reinsertion and showed moderate improvement in luminal       narrowing.      The stomach was normal.      The examined duodenum was normal.      Two biopsies were obtained with cold forceps for histology in the middle       third of the esophagus. Impression:            - Benign-appearing esophageal stenosis. Dilated.                        - Normal stomach.                        - Normal examined duodenum.                        - Biopsy performed in the middle third of the                         esophagus. Recommendation:        - Discharge patient to home.                        - Resume previous diet.                        - Continue present medications.                        - Await pathology results.                        - Repeat upper endoscopy in 4 weeks for retreatment. Procedure Code(s):     --- Professional ---                        [phone], Esophagogastroduodenoscopy, flexible,  transoral; with transendoscopic balloon dilation of                         esophagus (less than 30 mm diameter)                        43239, 59, Esophagogastroduodenoscopy, flexible,                         transoral; with biopsy, single or multiple Diagnosis Code(s):     --- Professional ---                        T18.108A, Unspecified foreign body in esophagus                         causing other injury, initial encounter                        R13.10, Dysphagia,  unspecified                        K22.2, Esophageal obstruction CPT copyright 2019 American Medical Association. All rights reserved. The codes documented in this report are preliminary and upon coder review may  be revised to meet current compliance requirements. Midge Minium MD, MD 02/21/2020 12:36:27 PM This report has been signed electronically. Number of Addenda: 0 Note Initiated On: 02/21/2020 12:19 PM Estimated Blood Loss:  Estimated blood loss: none.      [facility]

## 2020-02-21 NOTE — ED Provider Notes (Signed)
[facility] Emergency Department Provider Note   ____________________________________________   First MD Initiated Contact with Patient 02/21/20 [phone]     (approximate)  I have reviewed the triage vital signs and the nursing notes.   HISTORY  Chief Complaint Dysphagia    HPI Jade Edwards is a 64 y.o. female with a history of ADHD and acid reflux  Patient reports for several months has had difficulty swallowing foods and often things will feel like they get stuck.  2 days ago she ate a small amount of chicken and it feels like to become lodged in her esophagus.  She has been having to cough up or sort of spit up phlegmy thick mucus and cannot get any food or drink to swallow.  Not really painful, reports a slight stuck sensation in her mid chest present for 2 days  She was supposed to see GI but has not been able to get an appointment through [facility] yet  Symptoms have been ongoing since at least April  Had a previous endoscopy many years ago and was told it was normal.  No fevers or chills.  No Covid exposure.  Has received her 2 shot series of vaccination   History reviewed. No pertinent past medical history.  Patient Active Problem List   Diagnosis Date Noted  . Foreign body in esophagus   . Problems with swallowing and mastication   . Stricture and stenosis of esophagus   . Visit for preventive health examination 12/18/2013  . Achilles tendinosis 11/18/2012  . Routine general medical examination at a health care facility 11/18/2012  . Varicose veins of right lower extremity with pain 11/18/2012  . Chronic back pain greater than 3 months duration 09/23/2012  . Morbid obesity with BMI of 40.0-44.9, adult (HCC) 11/14/2011  . Attention deficit disorder of adult with hyperactivity 11/14/2011    Past Surgical History:  Procedure Laterality Date  . CESAREAN SECTION    . VASCULAR SURGERY      Prior to Admission medications   Medication  Sig Start Date End Date Taking? Authorizing Provider  amphetamine-dextroamphetamine (ADDERALL) 20 MG tablet Take one tablet in the morning and one tablet in the afternoon. Patient not taking: Reported on 02/21/2020 05/25/14   Sherlene Shams, MD    Allergies Codeine  Family History  Problem Relation Age of Onset  . Mental illness Mother     Social History Social History   Tobacco Use  . Smoking status: Never Smoker  . Smokeless tobacco: Never Used  Substance Use Topics  . Alcohol use: No  . Drug use: No    Review of Systems Constitutional: No fever/chills Eyes: No visual changes. ENT: No sore throat. Cardiovascular: Denies chest pain.  See HPI Respiratory: Denies shortness of breath. Gastrointestinal: No abdominal pain.   Musculoskeletal: Negative for muscle aches. Neurological: Negative for headaches or weakness.    ____________________________________________   PHYSICAL EXAM:  VITAL SIGNS: ED Triage Vitals  Enc Vitals Group     BP 02/21/20 0832 (!) 177/78     Pulse Rate 02/21/20 0832 80     Resp 02/21/20 0836 16     Temp 02/21/20 0836 97.7 F (36.5 C)     Temp Source 02/21/20 0836 Oral     SpO2 02/21/20 0832 99 %     Weight 02/21/20 0833 225 lb (102.1 kg)     Height 02/21/20 0833 5\' 3"  (1.6 m)     Head Circumference --  Peak Flow --      Pain Score 02/21/20 0832 3     Pain Loc --      Pain Edu? --      Excl. in GC? --     Constitutional: Alert and oriented. Well appearing and in no acute distress. Eyes: Conjunctivae are normal. Head: Atraumatic. Nose: No congestion/rhinnorhea. Mouth/Throat: Mucous membranes are moist. Neck: No stridor.  Posterior oropharynx appears clear.  She is not actively spitting up secretions, but reports she will occasionally have to spit up. Cardiovascular: Normal rate, regular rhythm. Grossly normal heart sounds.  Good peripheral circulation. Respiratory: Normal respiratory effort.  No retractions. Lungs  CTAB. Gastrointestinal: Soft and nontender. No distention. Musculoskeletal: No lower extremity tenderness nor edema. Neurologic:  Normal speech and language. No gross focal neurologic deficits are appreciated.  Skin:  Skin is warm, dry and intact. No rash noted. Psychiatric: Mood and affect are normal. Speech and behavior are normal.  ____________________________________________   LABS (all labs ordered are listed, but only abnormal results are displayed)  Labs Reviewed  CBC - Abnormal; Notable for the following components:      Result Value   RBC 5.42 (*)    Hemoglobin 15.3 (*)    HCT 47.6 (*)    All other components within normal limits  SARS CORONAVIRUS 2 BY RT PCR (HOSPITAL ORDER, PERFORMED IN [facility])  COMPREHENSIVE METABOLIC PANEL  LIPASE, BLOOD  SURGICAL PATHOLOGY  TROPONIN I (HIGH SENSITIVITY)   ____________________________________________  EKG  Reviewed interpreted at 845 Heart rate 80 QRS 85 QTc 440 Normal sinus rhythm, no evidence of acute ischemia there is occasional PVC slight artifact ____________________________________________  RADIOLOGY  DG Chest 2 View  Result Date: 02/21/2020 CLINICAL DATA:  Dysphagia.  Question esophageal impaction. EXAM: CHEST - 2 VIEW COMPARISON:  None. FINDINGS: The heart size and mediastinal contours are within normal limits. Both lungs are clear. Degenerative joint changes of the spine are noted. IMPRESSION: No active cardiopulmonary disease. Electronically Signed   By: Sherian Rein M.D.   On: 02/21/2020 09:25    Chest x-ray reviewed negative for acute ____________________________________________   PROCEDURES  Procedure(s) performed: None  Procedures  Critical Care performed: No  ____________________________________________   INITIAL IMPRESSION / ASSESSMENT AND PLAN / ED COURSE  Pertinent labs & imaging results that were available during my care of the patient were reviewed by me and considered in  my medical decision making (see chart for details).   Patient presents with what appears to be a somewhat progressive dysphagia, followed by possible esophageal impaction after eating chicken a couple days ago.  She has no acute airway involvement.  She is not in any significant pain or discomfort.  She appears overall well, but reports being unable to swallow or keep any food down for about 2 days.  Etiology is unclear, though I suspect esophageal nature  Reassuring clinical history and examination and EKG.  Check basic labs, troponin etc.  Case discussed with Dr. Servando Snare of gastroenterology, he will consult on patient this afternoon likely perform endoscopy for his plan    Patient family including patient's son at the bedside, understanding agreeable with plan to have further evaluated with gastroenterology today.  ____________________________________________   FINAL CLINICAL IMPRESSION(S) / ED DIAGNOSES  Final diagnoses:  Dysphagia, unspecified type        Note:  This document was prepared using Dragon voice recognition software and may include unintentional dictation errors       Sharyn Creamer,  MD 02/21/20 1625

## 2020-02-21 NOTE — ED Notes (Signed)
Pt attempted to eat half a teaspoon of pudding at 6:30 but it came back up. Otherwise last food/drink was yesterday.

## 2020-02-21 NOTE — Transfer of Care (Signed)
Immediate Anesthesia Transfer of Care Note  Patient: Jade Edwards  Procedure(s) Performed: ESOPHAGOGASTRODUODENOSCOPY (EGD) (N/A )  Patient Location: PACU and Endoscopy Unit  Anesthesia Type:General  Level of Consciousness: awake  Airway & Oxygen Therapy: Patient Spontanous Breathing and Patient connected to nasal cannula oxygen  Post-op Assessment: Report given to RN  Post vital signs: Reviewed and stable  Last Vitals:  Vitals Value Taken Time  BP 213/78 02/21/20 1210  Temp 36.1 C 02/21/20 1210  Pulse 93 02/21/20 1210  Resp 18 02/21/20 1210  SpO2 100 % 02/21/20 1210    Last Pain:  Vitals:   02/21/20 1210  TempSrc: Temporal  PainSc:          Complications: No complications documented.

## 2020-02-24 ENCOUNTER — Encounter: Payer: Self-pay | Admitting: Gastroenterology

## 2020-02-25 LAB — SURGICAL PATHOLOGY

## 2020-02-26 ENCOUNTER — Other Ambulatory Visit: Payer: Self-pay

## 2020-02-26 ENCOUNTER — Telehealth: Payer: Self-pay

## 2020-02-26 DIAGNOSIS — R1319 Other dysphagia: Secondary | ICD-10-CM

## 2020-02-26 MED ORDER — PANTOPRAZOLE SODIUM 40 MG PO TBEC
40.0000 mg | DELAYED_RELEASE_TABLET | Freq: Every day | ORAL | 3 refills | Status: DC
Start: 2020-02-26 — End: 2020-03-29

## 2020-02-26 NOTE — Telephone Encounter (Signed)
Pt notified of EGD results.  

## 2020-02-26 NOTE — Telephone Encounter (Signed)
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  The patient should be treated with a PPI if she is not already taking one.

## 2020-03-01 ENCOUNTER — Encounter: Payer: Self-pay | Admitting: Gastroenterology

## 2020-03-26 ENCOUNTER — Other Ambulatory Visit: Payer: Managed Care, Other (non HMO)

## 2020-03-29 ENCOUNTER — Other Ambulatory Visit: Payer: Self-pay

## 2020-03-29 DIAGNOSIS — R1319 Other dysphagia: Secondary | ICD-10-CM

## 2020-03-29 MED ORDER — PANTOPRAZOLE SODIUM 40 MG PO TBEC: 40.0000 mg | DELAYED_RELEASE_TABLET | Freq: Every day | ORAL | 3 refills | Status: DC

## 2020-03-30 ENCOUNTER — Ambulatory Visit
Admission: RE | Admit: 2020-03-30 | Payer: Managed Care, Other (non HMO) | Source: Home / Self Care | Admitting: Gastroenterology

## 2020-03-30 ENCOUNTER — Encounter: Admission: RE | Payer: Self-pay | Source: Home / Self Care

## 2020-03-30 SURGERY — ESOPHAGOGASTRODUODENOSCOPY (EGD) WITH PROPOFOL
Anesthesia: General

## 2020-04-16 ENCOUNTER — Other Ambulatory Visit: Payer: Self-pay

## 2020-04-16 ENCOUNTER — Other Ambulatory Visit
Admission: RE | Admit: 2020-04-16 | Discharge: 2020-04-16 | Disposition: A | Discharge status: Home / Self Care | Payer: Managed Care, Other (non HMO) | Source: Ambulatory Visit | Attending: Gastroenterology | Admitting: Gastroenterology

## 2020-04-16 DIAGNOSIS — Z20822 Contact with and (suspected) exposure to covid-19: Secondary | ICD-10-CM | POA: Diagnosis not present

## 2020-04-16 DIAGNOSIS — Z01812 Encounter for preprocedural laboratory examination: Secondary | ICD-10-CM | POA: Diagnosis not present

## 2020-04-16 LAB — SARS CORONAVIRUS 2 (TAT 6-24 HRS): SARS Coronavirus 2: NEGATIVE

## 2020-04-20 ENCOUNTER — Ambulatory Visit
Admission: RE | Admit: 2020-04-20 | Discharge: 2020-04-20 | Disposition: A | Discharge status: Home / Self Care | Payer: Managed Care, Other (non HMO) | Attending: Gastroenterology | Admitting: Gastroenterology

## 2020-04-20 ENCOUNTER — Ambulatory Visit: Payer: Managed Care, Other (non HMO) | Admitting: Registered Nurse

## 2020-04-20 ENCOUNTER — Other Ambulatory Visit: Payer: Self-pay

## 2020-04-20 ENCOUNTER — Encounter
Admission: RE | Disposition: A | Discharge status: Home / Self Care | Payer: Self-pay | Source: Home / Self Care | Attending: Gastroenterology

## 2020-04-20 DIAGNOSIS — K449 Diaphragmatic hernia without obstruction or gangrene: Secondary | ICD-10-CM | POA: Insufficient documentation

## 2020-04-20 DIAGNOSIS — F909 Attention-deficit hyperactivity disorder, unspecified type: Secondary | ICD-10-CM | POA: Diagnosis not present

## 2020-04-20 DIAGNOSIS — G8929 Other chronic pain: Secondary | ICD-10-CM | POA: Diagnosis not present

## 2020-04-20 DIAGNOSIS — K222 Esophageal obstruction: Secondary | ICD-10-CM | POA: Insufficient documentation

## 2020-04-20 DIAGNOSIS — Z6841 Body Mass Index (BMI) 40.0 and over, adult: Secondary | ICD-10-CM | POA: Diagnosis not present

## 2020-04-20 DIAGNOSIS — R1319 Other dysphagia: Secondary | ICD-10-CM

## 2020-04-20 DIAGNOSIS — M549 Dorsalgia, unspecified: Secondary | ICD-10-CM | POA: Diagnosis not present

## 2020-04-20 DIAGNOSIS — R1310 Dysphagia, unspecified: Secondary | ICD-10-CM | POA: Diagnosis not present

## 2020-04-20 DIAGNOSIS — Z79899 Other long term (current) drug therapy: Secondary | ICD-10-CM | POA: Diagnosis not present

## 2020-04-20 HISTORY — PX: ESOPHAGOGASTRODUODENOSCOPY (EGD) WITH PROPOFOL: SHX5813

## 2020-04-20 SURGERY — ESOPHAGOGASTRODUODENOSCOPY (EGD) WITH PROPOFOL
Anesthesia: General

## 2020-04-20 MED ORDER — PROPOFOL 10 MG/ML IV BOLUS
INTRAVENOUS | Status: DC | PRN
  Administered 2020-04-20: 90 mg via INTRAVENOUS

## 2020-04-20 MED ORDER — SODIUM CHLORIDE 0.9 % IV SOLN
INTRAVENOUS | Status: DC
  Administered 2020-04-20: 1000 mL via INTRAVENOUS

## 2020-04-20 MED ORDER — LIDOCAINE HCL (PF) 2 % IJ SOLN
INTRAMUSCULAR | Status: AC
  Filled 2020-04-20: qty 25

## 2020-04-20 MED ORDER — DEXMEDETOMIDINE HCL 200 MCG/2ML IV SOLN
INTRAVENOUS | Status: DC | PRN
  Administered 2020-04-20: 20 ug via INTRAVENOUS

## 2020-04-20 MED ORDER — PROPOFOL 500 MG/50ML IV EMUL
INTRAVENOUS | Status: DC | PRN
  Administered 2020-04-20: 175 ug/kg/min via INTRAVENOUS

## 2020-04-20 MED ORDER — GLYCOPYRROLATE 0.2 MG/ML IJ SOLN
INTRAMUSCULAR | Status: DC | PRN
  Administered 2020-04-20: .1 mg via INTRAVENOUS

## 2020-04-20 MED ORDER — DEXMEDETOMIDINE HCL IN NACL 80 MCG/20ML IV SOLN
INTRAVENOUS | Status: AC
  Filled 2020-04-20: qty 20

## 2020-04-20 MED ORDER — LIDOCAINE HCL (PF) 1 % IJ SOLN
INTRAMUSCULAR | Status: AC
  Filled 2020-04-20: qty 2

## 2020-04-20 MED ORDER — LIDOCAINE HCL (CARDIAC) PF 100 MG/5ML IV SOSY
PREFILLED_SYRINGE | INTRAVENOUS | Status: DC | PRN
  Administered 2020-04-20: 80 mg via INTRAVENOUS

## 2020-04-20 MED ORDER — PHENYLEPHRINE HCL (PRESSORS) 10 MG/ML IV SOLN
INTRAVENOUS | Status: AC
  Filled 2020-04-20: qty 1

## 2020-04-20 MED ORDER — PROPOFOL 500 MG/50ML IV EMUL
INTRAVENOUS | Status: AC
  Filled 2020-04-20: qty 250

## 2020-04-20 NOTE — Op Note (Signed)
[facility]astroenterology Patient Name: Jade Edwards Procedure Date: 04/20/2020 8:05 AM MRN: [id number] Account #: [id number] Date of Birth: Jul 16, 1956 Admit Type: Outpatient Age: 64 Room: [facility] ENDO ROOM 4 Gender: Female Note Status: Finalized Procedure:             Upper GI endoscopy Indications:           Dysphagia Providers:             Midge Minium MD, MD Referring MD:          Marylin Crosby. Greggory Stallion MD, MD (Referring MD) Medicines:             Propofol per Anesthesia Complications:         No immediate complications. Procedure:             Pre-Anesthesia Assessment:                        - Prior to the procedure, a History and Physical was                         performed, and patient medications and allergies were                         reviewed. The patient's tolerance of previous                         anesthesia was also reviewed. The risks and benefits                         of the procedure and the sedation options and risks                         were discussed with the patient. All questions were                         answered, and informed consent was obtained. Prior                         Anticoagulants: The patient has taken no previous                         anticoagulant or antiplatelet agents. ASA Grade                         Assessment: II - A patient with mild systemic disease.                         After reviewing the risks and benefits, the patient                         was deemed in satisfactory condition to undergo the                         procedure.                        After obtaining informed consent, the endoscope was  passed under direct vision. Throughout the procedure,                         the patient's blood pressure, pulse, and oxygen                         saturations were monitored continuously. The Endoscope                         was introduced through the mouth, and advanced to  the                         second part of duodenum. The upper GI endoscopy was                         accomplished without difficulty. The patient tolerated                         the procedure well. Findings:      A small hiatal hernia was present.      One benign-appearing, intrinsic moderate stenosis was found at the       gastroesophageal junction. The stenosis was traversed. A TTS dilator was       passed through the scope. Dilation with a 12-13.5-15 mm balloon dilator       was performed to 13.5 mm.      The stomach was normal.      The examined duodenum was normal. Impression:            - Small hiatal hernia.                        - Benign-appearing esophageal stenosis. Dilated.                        - Normal stomach.                        - Normal examined duodenum.                        - No specimens collected. Recommendation:        - Discharge patient to home.                        - Resume previous diet.                        - Continue present medications. Procedure Code(s):     --- Professional ---                        [phone], Esophagogastroduodenoscopy, flexible,                         transoral; with transendoscopic balloon dilation of                         esophagus (less than 30 mm diameter) Diagnosis Code(s):     --- Professional ---                        R13.10,  Dysphagia, unspecified                        K22.2, Esophageal obstruction CPT copyright 2019 American Medical Association. All rights reserved. The codes documented in this report are preliminary and upon coder review may  be revised to meet current compliance requirements. Midge Minium MD, MD 04/20/2020 8:20:24 AM This report has been signed electronically. Number of Addenda: 0 Note Initiated On: 04/20/2020 8:05 AM Estimated Blood Loss:  Estimated blood loss: none.      [facility]

## 2020-04-20 NOTE — Anesthesia Preprocedure Evaluation (Signed)
Anesthesia Evaluation  Patient identified by MRN, date of birth, ID band Patient awake    Reviewed: Allergy & Precautions, NPO status , Patient's Chart, lab work & pertinent test results  History of Anesthesia Complications Negative for: history of anesthetic complications  Airway Mallampati: II  TM Distance: >3 FB Neck ROM: Full    Dental no notable dental hx.    Pulmonary neg pulmonary ROS, neg sleep apnea, neg COPD,    breath sounds clear to auscultation- rhonchi (-) wheezing      Cardiovascular Exercise Tolerance: Good (-) hypertension(-) CAD, (-) Past MI, (-) Cardiac Stents and (-) CABG  Rhythm:Regular Rate:Normal - Systolic murmurs and - Diastolic murmurs    Neuro/Psych neg Seizures PSYCHIATRIC DISORDERS negative neurological ROS     GI/Hepatic negative GI ROS, Neg liver ROS,   Endo/Other  negative endocrine ROSneg diabetes  Renal/GU negative Renal ROS     Musculoskeletal negative musculoskeletal ROS (+)   Abdominal (+) + obese,   Peds  Hematology negative hematology ROS (+)   Anesthesia Other Findings   Reproductive/Obstetrics                             Anesthesia Physical Anesthesia Plan  ASA: II  Anesthesia Plan: General   Post-op Pain Management:    Induction: Intravenous  PONV Risk Score and Plan: 2 and Propofol infusion  Airway Management Planned: Natural Airway  Additional Equipment:   Intra-op Plan:   Post-operative Plan:   Informed Consent: I have reviewed the patients History and Physical, chart, labs and discussed the procedure including the risks, benefits and alternatives for the proposed anesthesia with the patient or authorized representative who has indicated his/her understanding and acceptance.     Dental advisory given  Plan Discussed with: CRNA and Anesthesiologist  Anesthesia Plan Comments:         Anesthesia Quick Evaluation

## 2020-04-20 NOTE — Transfer of Care (Signed)
Immediate Anesthesia Transfer of Care Note  Patient: Jade Edwards  Procedure(s) Performed: ESOPHAGOGASTRODUODENOSCOPY (EGD) WITH PROPOFOL (N/A )  Patient Location: PACU , Endoscopy Unit  Anesthesia Type:General  Level of Consciousness: drowsy  Airway & Oxygen Therapy: Patient Spontanous Breathing  Post-op Assessment: Report given to RN and Post -op Vital signs reviewed and stable  Post vital signs: Reviewed and stable  Last Vitals:  Vitals Value Taken Time  BP    Temp    Pulse    Resp    SpO2      Last Pain:  Vitals:   04/20/20 0743  TempSrc: Tympanic  PainSc: 0-No pain         Complications: No complications documented.

## 2020-04-20 NOTE — H&P (Signed)
Midge Minium, MD [facility] [address] [place], [place] [address] [phone] Fax : [phone]  Primary Care Physician:  Rayetta Humphrey, MD Primary Gastroenterologist:  Dr. Servando Snare  Pre-Procedure History & Physical: HPI:  Jade Edwards is a 64 y.o. female is here for an endoscopy.   No past medical history on file.  Past Surgical History:  Procedure Laterality Date  . CESAREAN SECTION    . ESOPHAGOGASTRODUODENOSCOPY N/A 02/21/2020   Procedure: ESOPHAGOGASTRODUODENOSCOPY (EGD);  Surgeon: Midge Minium, MD;  Location: [facility] ENDOSCOPY;  Service: Endoscopy;  Laterality: N/A;  . VASCULAR SURGERY      Prior to Admission medications   Medication Sig Start Date End Date Taking? Authorizing Provider  amphetamine-dextroamphetamine (ADDERALL) 20 MG tablet Take one tablet in the morning and one tablet in the afternoon. Patient not taking: Reported on 02/21/2020 05/25/14   Sherlene Shams, MD  pantoprazole (PROTONIX) 40 MG tablet Take 1 tablet (40 mg total) by mouth daily. 03/29/20   Midge Minium, MD    Allergies as of 03/29/2020 - Review Complete 02/21/2020  Allergen Reaction Noted  . Codeine  11/13/2011    Family History  Problem Relation Age of Onset  . Mental illness Mother     Social History   Socioeconomic History  . Marital status: Married    Spouse name: Not on file  . Number of children: Not on file  . Years of education: Not on file  . Highest education level: Not on file  Occupational History  . Not on file  Tobacco Use  . Smoking status: Never Smoker  . Smokeless tobacco: Never Used  Substance and Sexual Activity  . Alcohol use: No  . Drug use: No  . Sexual activity: Not on file  Other Topics Concern  . Not on file  Social History Narrative  . Not on file   Social Determinants of Health   Financial Resource Strain:   . Difficulty of Paying Living Expenses: Not on file  Food Insecurity:   . Worried About Programme researcher, broadcasting/film/video in the Last Year:  Not on file  . Ran Out of Food in the Last Year: Not on file  Transportation Needs:   . Lack of Transportation (Medical): Not on file  . Lack of Transportation (Non-Medical): Not on file  Physical Activity:   . Days of Exercise per Week: Not on file  . Minutes of Exercise per Session: Not on file  Stress:   . Feeling of Stress : Not on file  Social Connections:   . Frequency of Communication with Friends and Family: Not on file  . Frequency of Social Gatherings with Friends and Family: Not on file  . Attends Religious Services: Not on file  . Active Member of Clubs or Organizations: Not on file  . Attends Banker Meetings: Not on file  . Marital Status: Not on file  Intimate Partner Violence:   . Fear of Current or Ex-Partner: Not on file  . Emotionally Abused: Not on file  . Physically Abused: Not on file  . Sexually Abused: Not on file    Review of Systems: See HPI, otherwise negative ROS  Physical Exam: BP (!) 187/88   Pulse 92   Temp (!) 97.5 F (36.4 C) (Tympanic)   Resp 17   Ht 5\' 3"  (1.6 m)   Wt 104.3 kg   SpO2 100%   BMI 40.74 kg/m  General:   Alert,  pleasant and cooperative in NAD  Head:  Normocephalic and atraumatic. Neck:  Supple; no masses or thyromegaly. Lungs:  Clear throughout to auscultation.    Heart:  Regular rate and rhythm. Abdomen:  Soft, nontender and nondistended. Normal bowel sounds, without guarding, and without rebound.   Neurologic:  Alert and  oriented x4;  grossly normal neurologically.  Impression/Plan: Jade Edwards is here for an endoscopy to be performed for dysphagia  Risks, benefits, limitations, and alternatives regarding  endoscopy have been reviewed with the patient.  Questions have been answered.  All parties agreeable.   Midge Minium, MD  04/20/2020, 8:06 AM

## 2020-04-20 NOTE — Anesthesia Postprocedure Evaluation (Signed)
Anesthesia Post Note  Patient: Jade Edwards  Procedure(s) Performed: ESOPHAGOGASTRODUODENOSCOPY (EGD) WITH PROPOFOL (N/A )  Patient location during evaluation: Endoscopy Anesthesia Type: General Level of consciousness: awake and alert and oriented Pain management: pain level controlled Vital Signs Assessment: post-procedure vital signs reviewed and stable Respiratory status: spontaneous breathing, nonlabored ventilation and respiratory function stable Cardiovascular status: blood pressure returned to baseline and stable Postop Assessment: no signs of nausea or vomiting Anesthetic complications: no   No complications documented.   Last Vitals:  Vitals:   04/20/20 0832 04/20/20 0842  BP: 111/74 (!) 148/87  Pulse: 71 69  Resp: 16 16  Temp:    SpO2: 100% 100%    Last Pain:  Vitals:   04/20/20 0842  TempSrc:   PainSc: 0-No pain                 Shonia Skilling

## 2020-04-21 ENCOUNTER — Encounter: Payer: Self-pay | Admitting: Gastroenterology

## 2020-09-15 ENCOUNTER — Other Ambulatory Visit: Payer: Self-pay | Admitting: Family Medicine

## 2020-09-15 DIAGNOSIS — Z1231 Encounter for screening mammogram for malignant neoplasm of breast: Secondary | ICD-10-CM

## 2020-11-18 ENCOUNTER — Other Ambulatory Visit: Payer: Self-pay

## 2020-11-18 ENCOUNTER — Observation Stay
Admission: EM | Admit: 2020-11-18 | Discharge: 2020-11-19 | Disposition: A | Discharge status: Home / Self Care | Payer: BC Managed Care – PPO | Attending: Hospitalist | Admitting: Hospitalist

## 2020-11-18 ENCOUNTER — Other Ambulatory Visit (INDEPENDENT_AMBULATORY_CARE_PROVIDER_SITE_OTHER): Payer: Self-pay | Admitting: Vascular Surgery

## 2020-11-18 ENCOUNTER — Encounter
Admission: EM | Disposition: A | Discharge status: Home / Self Care | Payer: Self-pay | Source: Home / Self Care | Attending: Emergency Medicine

## 2020-11-18 DIAGNOSIS — Z20822 Contact with and (suspected) exposure to covid-19: Secondary | ICD-10-CM | POA: Diagnosis not present

## 2020-11-18 DIAGNOSIS — Z8616 Personal history of COVID-19: Secondary | ICD-10-CM | POA: Diagnosis not present

## 2020-11-18 DIAGNOSIS — I7300 Raynaud's syndrome without gangrene: Secondary | ICD-10-CM | POA: Diagnosis not present

## 2020-11-18 DIAGNOSIS — I998 Other disorder of circulatory system: Secondary | ICD-10-CM

## 2020-11-18 DIAGNOSIS — M79671 Pain in right foot: Secondary | ICD-10-CM | POA: Diagnosis not present

## 2020-11-18 DIAGNOSIS — F909 Attention-deficit hyperactivity disorder, unspecified type: Secondary | ICD-10-CM | POA: Diagnosis present

## 2020-11-18 DIAGNOSIS — Z6841 Body Mass Index (BMI) 40.0 and over, adult: Secondary | ICD-10-CM

## 2020-11-18 DIAGNOSIS — K222 Esophageal obstruction: Secondary | ICD-10-CM

## 2020-11-18 DIAGNOSIS — M62271 Nontraumatic ischemic infarction of muscle, right ankle and foot: Secondary | ICD-10-CM | POA: Diagnosis not present

## 2020-11-18 HISTORY — PX: LOWER EXTREMITY ANGIOGRAPHY: CATH118251

## 2020-11-18 LAB — BASIC METABOLIC PANEL
Anion gap: 11 (ref 5–15)
BUN: 18 mg/dL (ref 8–23)
CO2: 24 mmol/L (ref 22–32)
Calcium: 8.8 mg/dL — ABNORMAL LOW (ref 8.9–10.3)
Chloride: 104 mmol/L (ref 98–111)
Creatinine, Ser: 1.01 mg/dL — ABNORMAL HIGH (ref 0.44–1.00)
GFR, Estimated: >60 mL/min (ref >60)
Glucose, Bld: 114 mg/dL — ABNORMAL HIGH (ref 70–99)
Potassium: 3.8 mmol/L (ref 3.5–5.1)
Sodium: 139 mmol/L (ref 135–145)

## 2020-11-18 LAB — HEPATIC FUNCTION PANEL
ALT: 19 U/L (ref 0–44)
AST: 23 U/L (ref 15–41)
Albumin: 3.8 g/dL (ref 3.5–5.0)
Alkaline Phosphatase: 99 U/L (ref 38–126)
Bilirubin, Direct: 0.1 mg/dL (ref 0.0–0.2)
Indirect Bilirubin: 0.4 mg/dL (ref 0.3–0.9)
Total Bilirubin: 0.5 mg/dL (ref 0.3–1.2)
Total Protein: 7.3 g/dL (ref 6.5–8.1)

## 2020-11-18 LAB — CBC WITH DIFFERENTIAL/PLATELET
Abs Immature Granulocytes: 0.01 10*3/uL (ref 0.00–0.07)
Basophils Absolute: 0 10*3/uL (ref 0.0–0.1)
Basophils Relative: 0 %
Eosinophils Absolute: 0.1 10*3/uL (ref 0.0–0.5)
Eosinophils Relative: 2 %
HCT: 47.4 % — ABNORMAL HIGH (ref 36.0–46.0)
Hemoglobin: 15.2 g/dL — ABNORMAL HIGH (ref 12.0–15.0)
Immature Granulocytes: 0 %
Lymphocytes Relative: 25 %
Lymphs Abs: 1.8 10*3/uL (ref 0.7–4.0)
MCH: 27.8 pg (ref 26.0–34.0)
MCHC: 32.1 g/dL (ref 30.0–36.0)
MCV: 86.8 fL (ref 80.0–100.0)
Monocytes Absolute: 0.4 10*3/uL (ref 0.1–1.0)
Monocytes Relative: 6 %
Neutro Abs: 4.7 10*3/uL (ref 1.7–7.7)
Neutrophils Relative %: 67 %
Platelets: 249 10*3/uL (ref 150–400)
RBC: 5.46 MIL/uL — ABNORMAL HIGH (ref 3.87–5.11)
RDW: 14 % (ref 11.5–15.5)
WBC: 7 10*3/uL (ref 4.0–10.5)
nRBC: 0 % (ref 0.0–0.2)

## 2020-11-18 LAB — PROTIME-INR
INR: 1 (ref 0.8–1.2)
Prothrombin Time: 12.6 seconds (ref 11.4–15.2)

## 2020-11-18 LAB — RESP PANEL BY RT-PCR (FLU A&B, COVID) ARPGX2
Influenza A by PCR: NEGATIVE
Influenza B by PCR: NEGATIVE
SARS Coronavirus 2 by RT PCR: NEGATIVE

## 2020-11-18 LAB — APTT: aPTT: 25 seconds (ref 24–36)

## 2020-11-18 SURGERY — LOWER EXTREMITY ANGIOGRAPHY
Anesthesia: Moderate Sedation | Laterality: Right

## 2020-11-18 MED ORDER — SODIUM CHLORIDE 0.9 % IV SOLN: INTRAVENOUS | Status: DC

## 2020-11-18 MED ORDER — HYDROMORPHONE HCL 1 MG/ML IJ SOLN: 1.0000 mg | Freq: Once | INTRAMUSCULAR | Status: DC | PRN

## 2020-11-18 MED ORDER — HEPARIN (PORCINE) 25000 UT/250ML-% IV SOLN
1200.0000 [IU]/h | INTRAVENOUS | Status: DC
  Administered 2020-11-18: 1200 [IU]/h via INTRAVENOUS
  Filled 2020-11-18: qty 250

## 2020-11-18 MED ORDER — FENTANYL CITRATE (PF) 100 MCG/2ML IJ SOLN
INTRAMUSCULAR | Status: AC
  Filled 2020-11-18: qty 2

## 2020-11-18 MED ORDER — ALTEPLASE 1 MG/ML SYRINGE FOR VASCULAR PROCEDURE
INTRAMUSCULAR | Status: DC | PRN
  Administered 2020-11-18: 6 mg via INTRA_ARTERIAL

## 2020-11-18 MED ORDER — ONDANSETRON HCL 4 MG/2ML IJ SOLN: 4.0000 mg | Freq: Four times a day (QID) | INTRAMUSCULAR | Status: DC | PRN

## 2020-11-18 MED ORDER — METHYLPREDNISOLONE SODIUM SUCC 125 MG IJ SOLR: 125.0000 mg | Freq: Once | INTRAMUSCULAR | Status: DC | PRN

## 2020-11-18 MED ORDER — MIDAZOLAM HCL 2 MG/ML PO SYRP: 8.0000 mg | ORAL_SOLUTION | Freq: Once | ORAL | Status: DC | PRN

## 2020-11-18 MED ORDER — TIROFIBAN (AGGRASTAT) BOLUS VIA INFUSION
25.0000 ug/kg | Freq: Once | INTRAVENOUS | Status: AC
  Administered 2020-11-18: 2597.5 ug via INTRAVENOUS
  Filled 2020-11-18: qty 52

## 2020-11-18 MED ORDER — CEFAZOLIN SODIUM-DEXTROSE 2-4 GM/100ML-% IV SOLN
2.0000 g | Freq: Once | INTRAVENOUS | Status: AC
  Administered 2020-11-18: 2 g via INTRAVENOUS

## 2020-11-18 MED ORDER — ALTEPLASE 2 MG IJ SOLR
INTRAMUSCULAR | Status: AC
  Filled 2020-11-18: qty 6

## 2020-11-18 MED ORDER — PANTOPRAZOLE SODIUM 40 MG IV SOLR
40.0000 mg | INTRAVENOUS | Status: DC
  Administered 2020-11-18 – 2020-11-19 (×2): 40 mg via INTRAVENOUS
  Filled 2020-11-18 (×2): qty 40

## 2020-11-18 MED ORDER — NITROGLYCERIN 1 MG/10 ML FOR IR/CATH LAB
INTRA_ARTERIAL | Status: DC | PRN
  Administered 2020-11-18: 300 ug

## 2020-11-18 MED ORDER — TIROFIBAN HCL IV 12.5 MG/250 ML
INTRAVENOUS | Status: AC
  Administered 2020-11-18: 0.075 ug/kg/min via INTRAVENOUS
  Filled 2020-11-18: qty 250

## 2020-11-18 MED ORDER — ONDANSETRON HCL 4 MG PO TABS: 4.0000 mg | ORAL_TABLET | Freq: Four times a day (QID) | ORAL | Status: DC | PRN

## 2020-11-18 MED ORDER — FENTANYL CITRATE (PF) 100 MCG/2ML IJ SOLN
INTRAMUSCULAR | Status: DC | PRN
  Administered 2020-11-18: 50 ug via INTRAVENOUS

## 2020-11-18 MED ORDER — DIPHENHYDRAMINE HCL 50 MG/ML IJ SOLN: 50.0000 mg | Freq: Once | INTRAMUSCULAR | Status: DC | PRN

## 2020-11-18 MED ORDER — MIDAZOLAM HCL 2 MG/2ML IJ SOLN
INTRAMUSCULAR | Status: DC | PRN
  Administered 2020-11-18: 2 mg via INTRAVENOUS

## 2020-11-18 MED ORDER — MIDAZOLAM HCL 5 MG/5ML IJ SOLN
INTRAMUSCULAR | Status: AC
  Filled 2020-11-18: qty 5

## 2020-11-18 MED ORDER — ACETAMINOPHEN 325 MG PO TABS: 650.0000 mg | ORAL_TABLET | Freq: Four times a day (QID) | ORAL | Status: DC | PRN

## 2020-11-18 MED ORDER — HEPARIN SODIUM (PORCINE) 1000 UNIT/ML IJ SOLN
INTRAMUSCULAR | Status: AC
  Filled 2020-11-18: qty 1

## 2020-11-18 MED ORDER — HEPARIN BOLUS VIA INFUSION
4000.0000 [IU] | Freq: Once | INTRAVENOUS | Status: AC
  Administered 2020-11-18: 4000 [IU] via INTRAVENOUS
  Filled 2020-11-18: qty 4000

## 2020-11-18 MED ORDER — TIROFIBAN HCL IN NACL 5-0.9 MG/100ML-% IV SOLN
0.0750 ug/kg/min | INTRAVENOUS | Status: DC
  Administered 2020-11-19: 0.075 ug/kg/min via INTRAVENOUS
  Filled 2020-11-18 (×3): qty 100

## 2020-11-18 MED ORDER — ACETAMINOPHEN 650 MG RE SUPP: 650.0000 mg | Freq: Four times a day (QID) | RECTAL | Status: DC | PRN

## 2020-11-18 MED ORDER — HEPARIN SODIUM (PORCINE) 1000 UNIT/ML IJ SOLN
INTRAMUSCULAR | Status: DC | PRN
  Administered 2020-11-18: 5000 [IU] via INTRAVENOUS

## 2020-11-18 SURGICAL SUPPLY — 17 items
BALLN ULTRVRSE 3X100X150 (BALLOONS) ×2
BALLOON ULTRVRSE 3X100X150 (BALLOONS) ×1 IMPLANT
CANISTER PENUMBRA ENGINE (MISCELLANEOUS) ×2 IMPLANT
CATH ANGIO 5F PIGTAIL 65CM (CATHETERS) ×2 IMPLANT
CATH BEACON 5 .038 100 VERT TP (CATHETERS) ×2 IMPLANT
CATH INDIGO CAT6 KIT (CATHETERS) ×2 IMPLANT
COVER PROBE U/S 5X48 (MISCELLANEOUS) ×2 IMPLANT
DEVICE SAFEGUARD 24CM (GAUZE/BANDAGES/DRESSINGS) ×2 IMPLANT
DEVICE STARCLOSE SE CLOSURE (Vascular Products) ×2 IMPLANT
GLIDEWIRE ADV .035X260CM (WIRE) ×2 IMPLANT
KIT ENCORE 26 ADVANTAGE (KITS) ×2 IMPLANT
PACK ANGIOGRAPHY (CUSTOM PROCEDURE TRAY) ×2 IMPLANT
SHEATH BRITE TIP 5FRX11 (SHEATH) ×2 IMPLANT
SHEATH PINNACLE ST 6F 65CM (SHEATH) ×2 IMPLANT
TUBING CONTRAST HIGH PRESS 72 (TUBING) ×2 IMPLANT
WIRE G V18X300CM (WIRE) ×2 IMPLANT
WIRE GUIDERIGHT .035X150 (WIRE) ×2 IMPLANT

## 2020-11-18 NOTE — ED Provider Notes (Signed)
[facility] Emergency Department Provider Note  ____________________________________________   Event Date/Time   First MD Initiated Contact with Patient 11/18/20 [phone]     (approximate)  I have reviewed the triage vital signs and the nursing notes.   HISTORY  Chief Complaint Circulatory Problem    HPI Jade Edwards is a 65 y.o. female with varicose veins, Raynaud's who comes in for issues with her right foot.  Patient states over the past 2 days she has noticed some increasing pain in her right foot and some tingling sensation.  This morning she noted that her foot was pale and may be had a faint pulse.  Her son and daughter-in-law who are both in healthcare evaluated and stated that there was a very faint pulse however upon getting to the emergency room no pulses palpated and the daughter-in-law states that her foot is more discolored than previously.  According to patient she has had some discoloration of her foot previously from her nods but states that usually goes away and she does not normally have any pain or tingling with it.  These have been constant, nothing makes it better, nothing makes it worse      History reviewed. No pertinent past medical history.  Patient Active Problem List   Diagnosis Date Noted  . Foreign body in esophagus   . Problems with swallowing and mastication   . Stricture and stenosis of esophagus   . Visit for preventive health examination 12/18/2013  . Achilles tendinosis 11/18/2012  . Routine general medical examination at a health care facility 11/18/2012  . Varicose veins of right lower extremity with pain 11/18/2012  . Chronic back pain greater than 3 months duration 09/23/2012  . Morbid obesity with BMI of 40.0-44.9, adult (HCC) 11/14/2011  . Attention deficit disorder of adult with hyperactivity 11/14/2011    Past Surgical History:  Procedure Laterality Date  . CESAREAN SECTION    .  ESOPHAGOGASTRODUODENOSCOPY N/A 02/21/2020   Procedure: ESOPHAGOGASTRODUODENOSCOPY (EGD);  Surgeon: Midge Minium, MD;  Location[facility] ENDOSCOPY;  Service: Endoscopy;  Laterality: N/A;  . ESOPHAGOGASTRODUODENOSCOPY (EGD) WITH PROPOFOL N/A 04/20/2020   Procedure: ESOPHAGOGASTRODUODENOSCOPY (EGD) WITH PROPOFOL;  Surgeon: Midge Minium, MD;  Location: [facility] ENDOSCOPY;  Service: Endoscopy;  Laterality: N/A;  . VASCULAR SURGERY      Prior to Admission medications   Medication Sig Start Date End Date Taking? Authorizing Provider  amphetamine-dextroamphetamine (ADDERALL) 20 MG tablet Take one tablet in the morning and one tablet in the afternoon. Patient not taking: Reported on 02/21/2020 05/25/14   Sherlene Shams, MD  pantoprazole (PROTONIX) 40 MG tablet Take 1 tablet (40 mg total) by mouth daily. 03/29/20   Midge Minium, MD    Allergies Codeine  Family History  Problem Relation Age of Onset  . Mental illness Mother     Social History Social History   Tobacco Use  . Smoking status: Never Smoker  . Smokeless tobacco: Never Used  Substance Use Topics  . Alcohol use: No  . Drug use: No      Review of Systems Constitutional: No fever/chills Eyes: No visual changes. ENT: No sore throat. Cardiovascular: Denies chest pain. Respiratory: Denies shortness of breath. Gastrointestinal: No abdominal pain.  No nausea, no vomiting.  No diarrhea.  No constipation. Genitourinary: Negative for dysuria. Musculoskeletal: Right foot pain and discoloration Skin: Negative for rash. Neurological: Negative for headaches, focal weakness or numbness. All other ROS negative ____________________________________________   PHYSICAL EXAM:  VITAL SIGNS: ED Triage  Vitals  Enc Vitals Group     BP 11/18/20 0945 138/90     Pulse Rate 11/18/20 0945 94     Resp 11/18/20 0945 20     Temp 11/18/20 0945 98 F (36.7 C)     Temp Source 11/18/20 0945 Oral     SpO2 11/18/20 0945 99 %     Weight 11/18/20 0946 229 lb  (103.9 kg)     Height 11/18/20 0946 5\' 3"  (1.6 m)     Head Circumference --      Peak Flow --      Pain Score 11/18/20 0946 0     Pain Loc --      Pain Edu? --      Excl. in GC? --     Constitutional: Alert and oriented. Well appearing and in no acute distress. Eyes: Conjunctivae are normal. EOMI. Head: Atraumatic. Nose: No congestion/rhinnorhea. Mouth/Throat: Mucous membranes are moist.   Neck: No stridor. Trachea Midline. FROM Cardiovascular: Normal rate, regular rhythm. Grossly normal heart sounds.  Good peripheral circulation. Respiratory: Normal respiratory effort.  No retractions. Lungs CTAB. Gastrointestinal: Soft and nontender. No distention. No abdominal bruits.  Musculoskeletal: Discoloration noted to the right foot, purple, posterior tibial artery confirmed on Doppler.  Unable to Doppler DP.  Neurologic:  Normal speech and language. No gross focal neurologic deficits are appreciated.  Skin:  Skin is warm, dry and intact. No rash noted. Psychiatric: Mood and affect are normal. Speech and behavior are normal. GU: Deferred   ____________________________________________   LABS (all labs ordered are listed, but only abnormal results are displayed)  Labs Reviewed  RESP PANEL BY RT-PCR (FLU A&B, COVID) [id number]  CBC WITH DIFFERENTIAL/PLATELET  BASIC METABOLIC PANEL  PROTIME-INR  APTT  HEPATIC FUNCTION PANEL   ____________________________________________   ED ECG REPORT I, 11/20/20, the attending physician, personally viewed and interpreted this ECG.  Normal sinus rate of 88, no ST elevation, no T wave inversions, normal intervals ____________________________________________    PROCEDURES  Procedure(s) performed (including Critical Care):  .Critical Care Performed by: Concha Se, MD Authorized by: Concha Se, MD   Critical care provider statement:    Critical care time (minutes):  45   Critical care was necessary to treat or prevent imminent or  life-threatening deterioration of the following conditions: Ischemic foot requiring heparin and vascular consult.   Critical care was time spent personally by me on the following activities:  Discussions with consultants, evaluation of patient's response to treatment, examination of patient, ordering and performing treatments and interventions, ordering and review of laboratory studies, ordering and review of radiographic studies, pulse oximetry, re-evaluation of patient's condition, obtaining history from patient or surrogate and review of old charts     ____________________________________________   INITIAL IMPRESSION / ASSESSMENT AND PLAN / ED COURSE  CRISTEN BREDESON was evaluated in Emergency Department on 11/18/2020 for the symptoms described in the history of present illness. She was evaluated in the context of the global COVID-19 pandemic, which necessitated consideration that the patient might be at risk for infection with the SARS-CoV-2 virus that causes COVID-19. Institutional protocols and algorithms that pertain to the evaluation of patients at risk for COVID-19 are in a state of rapid change based on information released by regulatory bodies including the CDC and federal and state organizations. These policies and algorithms were followed during the patient's care in the ED.    Patient is a 65 year old who comes in with concern for ischemic  foot.  Unable to Doppler DP.  Patient denies any chest pain to suggest dissection.  Will get EKG to evaluate for atrial fibrillation.  Get labs to evaluate for Electra abnormalities, AKI.  Denies any injury to suggest fracture.  Will emergently consult vascular surgery due to concern for ischemic foot  Discussed with Dr. Gilda Crease who recommended heparin and they will come down to evaluate patient  Vascular will take patient to angiography.  Recommend admission to hospital team.  Heparin was started        ____________________________________________   FINAL CLINICAL IMPRESSION(S) / ED DIAGNOSES   Final diagnoses:  Ischemic foot      MEDICATIONS GIVEN DURING THIS VISIT:  Medications  heparin bolus via infusion 4,000 Units (4,000 Units Intravenous Bolus from Bag 11/18/20 1041)    Followed by  heparin ADULT infusion 100 units/mL (25000 units/233mL) (1,200 Units/hr Intravenous New Bag/Given 11/18/20 1041)     ED Discharge Orders    None       Note:  This document was prepared using Dragon voice recognition software and may include unintentional dictation errors.   Concha Se, MD 11/18/20 1128

## 2020-11-18 NOTE — ED Triage Notes (Signed)
Pt with hx of Reynauds', pt with right foot that is purple at toes, faint pedal pulse noted on right foot by palpation. Pt right foot cool to touch, nail beds purple, foot with mild edema.

## 2020-11-18 NOTE — H&P (Signed)
History and Physical   Jade Edwards YQM:[id number] DOB: [birth year] DOA: 11/18/2020  PCP: Rayetta Humphrey, MD  Patient coming from: home  I have personally briefly reviewed patient's old medical records in [facility] EMR.  Chief Concern: bilateral hand joint pain  HPI: Jade Edwards is a 65 y.o. female with medical history significant for raynaud's syndrome, acid reflux, presents emergency department for chief concerns of right foot pain.  Patient states the right foot pain started about 2 days ago. She denies trauma. She reports the pain is not bad, however the numbness and tingling is what bothered her the most. She states the reason why she did not present earlier is because she is used to the tingling.  She states she has never felt this way before.  She reports that she has Raynaud's of her bilateral lower extremity and upper extremity frequently. She states this is normal for her.   Social history: She denies tobacco, EtOH, recreational drug use.  Vaccination: Patient is vaccinated for COVID-19 with two doses (11/24/2019 and 12/16/2019) and was recently tested positive for COVID-19 about 2 months ago.   ROS: Constitutional: no weight change, no fever ENT/Mouth: no sore throat, no rhinorrhea Eyes: no eye pain, no vision changes Cardiovascular: no chest pain, no dyspnea,  no edema, no palpitations Respiratory: no cough, no sputum, no wheezing Gastrointestinal: no nausea, no vomiting, no diarrhea, no constipation Genitourinary: no urinary incontinence, no dysuria, no hematuria Musculoskeletal: no arthralgias, no myalgias Skin: no skin lesions, no pruritus, Neuro: + weakness, no loss of consciousness, no syncope Psych: no anxiety, no depression, + decrease appetite Heme/Lymph: no bruising, no bleeding  ED Course: Discussed with emergency medicine provider, patient requiring hospitalization due to ischemic limb.  Vitals in the emergency department was remarkable for  temperature of 98, respiration rate of 20, heart rate of 94, blood pressure 138/90, patient saturation SPO2 of 99% on room air.  ED provider called vascular, Dr. Gilda Crease who will be taking patient to the OR.  Patient was started on heparin GTT. About Assessment/Plan  Principal Problem:   Right foot pain Active Problems:   Morbid obesity with BMI of 40.0-44.9, adult (HCC)   Attention deficit disorder of adult with hyperactivity   Stricture and stenosis of esophagus   Acute right foot pain secondary to ischemic limb- -Continue heparin GTT -Vascular has been consulted And will take patient to the OR -Keep patient n.p.o. -Admit to progressive cardiac with observation and telemetry  Raynaud's syndrome-outpatient follow-up  Morbid obesity-outpatient follow-up with PCP for weight loss recommendations  Chart reviewed.   DVT prophylaxis: Heparin GTT Code Status: Full code Diet: N.p.o. Family Communication: Updated son at bedside Disposition Plan: Pending clinical course Consults called: Vascular, Dr. Marena Chancy Admission status: Observation, progressive cardiac, with telemetry  History reviewed. No pertinent past medical history.  Past Surgical History:  Procedure Laterality Date  . CESAREAN SECTION    . ESOPHAGOGASTRODUODENOSCOPY N/A 02/21/2020   Procedure: ESOPHAGOGASTRODUODENOSCOPY (EGD);  Surgeon: Midge Minium, MD;  Location: [facility] ENDOSCOPY;  Service: Endoscopy;  Laterality: N/A;  . ESOPHAGOGASTRODUODENOSCOPY (EGD) WITH PROPOFOL N/A 04/20/2020   Procedure: ESOPHAGOGASTRODUODENOSCOPY (EGD) WITH PROPOFOL;  Surgeon: Midge Minium, MD;  Location: [facility] ENDOSCOPY;  Service: Endoscopy;  Laterality: N/A;  . VASCULAR SURGERY    " Social History:  reports that she has never smoked. She has never used smokeless tobacco. She reports that she does not drink alcohol and does not use drugs.  Allergies  Allergen Reactions  . Codeine Nausea Only  .  Epinephrine Palpitations   Family History   Problem Relation Age of Onset  . Mental illness Mother    Family history: Family history reviewed and not pertinent  Prior to Admission medications   Medication Sig Start Date End Date Taking? Authorizing Provider  amphetamine-dextroamphetamine (ADDERALL) 20 MG tablet Take one tablet in the morning and one tablet in the afternoon. Patient not taking: Reported on 02/21/2020 05/25/14   Sherlene Shams, MD  pantoprazole (PROTONIX) 40 MG tablet Take 1 tablet (40 mg total) by mouth daily. 03/29/20   Midge Minium, MD   Physical Exam: Vitals:   11/18/20 1500 11/18/20 1515 11/18/20 1540 11/18/20 1618  BP: (!) 156/74 (!) 147/75 140/67 (!) 143/75  Pulse: 69 67 67 68  Resp: 20 12 14 14   Temp:   98.2 F (36.8 C) 97.9 F (36.6 C)  TempSrc:   Oral Oral  SpO2: 98% 99% 99% 100%  Weight:      Height:       Constitutional: appears younger than chronological age, NAD, calm, comfortable Eyes: PERRL, lids and conjunctivae normal ENMT: Mucous membranes are moist. Posterior pharynx clear of any exudate or lesions. Age-appropriate dentition. Hearing appropriate for Neck: normal, supple, no masses, no thyromegaly Respiratory: clear to auscultation bilaterally, no wheezing, no crackles. Normal respiratory effort. No accessory muscle use.  Cardiovascular: Regular rate and rhythm, no murmurs / rubs / gallops. No extremity edema. 2+ pedal pulses on the left lower extremity.  Pedal pulses were not palpable in the right foot.  Patient right foot is blue. No carotid bruits.  Abdomen: Obese abdomen, no tenderness, no masses palpated, no hepatosplenomegaly. Bowel sounds positive.  Musculoskeletal: no clubbing / cyanosis. No joint deformity upper and lower extremities. Good ROM, no contractures, no atrophy. Normal muscle tone.  Skin: no rashes, lesions, ulcers. No induration Neurologic: Sensation intact. Strength 5/5 in all 4.  Psychiatric: Normal judgment and insight. Alert and oriented x 3. Normal mood.   EKG:  independently reviewed, showing normal sinus rhythm with rate of 88, QTc 416  Chest x-ray on Admission: Not indicated  Labs on Admission: I have personally reviewed following labs  CBC: Recent Labs  Lab 11/18/20 1016  WBC 7.0  NEUTROABS 4.7  HGB 15.2*  HCT 47.4*  MCV 86.8  PLT 249   Basic Metabolic Panel: Recent Labs  Lab 11/18/20 1016  NA 139  K 3.8  CL 104  CO2 24  GLUCOSE 114*  BUN 18  CREATININE 1.01*  CALCIUM 8.8*   GFR: Estimated Creatinine Clearance: 65.7 mL/min (A) (by C-G formula based on SCr of 1.01 mg/dL (H)).  Liver Function Tests: Recent Labs  Lab 11/18/20 1016  AST 23  ALT 19  ALKPHOS 99  BILITOT 0.5  PROT 7.3  ALBUMIN 3.8   Coagulation Profile: Recent Labs  Lab 11/18/20 1016  INR 1.0   Serenity Batley N Jalilah Wiltsie D.O. [facility]  If 7PM-7AM, please contact overnight-coverage provider If 7AM-7PM, please contact day coverage provider [email or web address]  11/18/2020, 5:08 PM

## 2020-11-18 NOTE — Consult Note (Signed)
ANTICOAGULATION CONSULT NOTE - Initial Consult  Pharmacy Consult for heparin drip Indication: ischemic foot  Allergies  Allergen Reactions  . Codeine     Patient Measurements: Height: 5\' 3"  (160 cm) Weight: 103.9 kg (229 lb) IBW/kg (Calculated) : 52.4 Heparin Dosing Weight: 77kg  Vital Signs: Temp: 98 F (36.7 C) (03/31 0945) Temp Source: Oral (03/31 0945) BP: 138/90 (03/31 0945) Pulse Rate: 94 (03/31 0945)  Labs: No results for input(s): HGB, HCT, PLT, APTT, LABPROT, INR, HEPARINUNFRC, HEPRLOWMOCWT, CREATININE, CKTOTAL, CKMB, TROPONINIHS in the last 72 hours.  CrCl cannot be calculated (Patient's most recent lab result is older than the maximum 21 days allowed.).   Medical History: Morbid Obesity (BMI > 40)  Medications/Assessment: No pta anticoagulation of record  Goal of Therapy:  Heparin level 0.3-0.7 units/ml Monitor platelets by anticoagulation protocol: Yes   Plan:  - CBC, aPTT, INR ordered - will follow - CBC daily on heparin drip  - Will start heparin bolus of 4000 units, followed by 1200 units/hr - Will assess heparin level in 6 hours per protocol  01-23-1989, PharmD, BCPS Clinical Pharmacist 11/18/2020 10:18 AM

## 2020-11-18 NOTE — ED Notes (Signed)
OR nurses at bedside to pick up patient. Report given to floor nurse.

## 2020-11-18 NOTE — Consult Note (Signed)
[facility] Vascular Consult Note  MRN : [id number]  Jade Edwards is a 65 y.o. (03/14/1956) female who presents with chief complaint of  Chief Complaint  Patient presents with  . Circulatory Problem   History of Present Illness:  Jade Edwards is a 65 year old female with varicose veins, Raynaud's who comes in for issues with her right foot.  Patient states over the past 2 days she has noticed some increasing pain in her right foot and some tingling sensation.  This morning she noted that her foot was pale and may be had a faint pulse.  Her son and daughter-in-law who are both in healthcare evaluated and stated that there was a very faint pulse however upon getting to the emergency room no pulses palpated and the daughter-in-law states that her foot is more discolored than previously.  According to patient she has had some discoloration of her foot previously from her Raynaud's but states that usually goes away and she does not normally have any pain or tingling with it.  These have been constant, nothing makes it better, nothing makes it worse.   Vascular surgery was consulted by Dr. Fuller Plan via Epic for possible right lower extremity ischemia.  Current Facility-Administered Medications  Medication Dose Route Frequency Provider Last Rate Last Admin  . 0.9 %  sodium chloride infusion   Intravenous Continuous Ellen Mayol A, PA-C      . acetaminophen (TYLENOL) tablet 650 mg  650 mg Oral Q6H PRN Cox, Amy N, DO       Or  . acetaminophen (TYLENOL) suppository 650 mg  650 mg Rectal Q6H PRN Cox, Amy N, DO      . heparin ADULT infusion 100 units/mL (25000 units/267mL)  1,200 Units/hr Intravenous Continuous Cox, Amy N, DO 12 mL/hr at 11/18/20 1041 1,200 Units/hr at 11/18/20 1041  . ondansetron (ZOFRAN) tablet 4 mg  4 mg Oral Q6H PRN Cox, Amy N, DO       Or  . ondansetron (ZOFRAN) injection 4 mg  4 mg Intravenous Q6H PRN Cox, Amy N, DO      . pantoprazole  (PROTONIX) injection 40 mg  40 mg Intravenous Q24H Cox, Amy N, DO       Current Outpatient Medications  Medication Sig Dispense Refill  . amphetamine-dextroamphetamine (ADDERALL) 20 MG tablet Take one tablet in the morning and one tablet in the afternoon. (Patient taking differently: Take by mouth 2 (two) times daily. Take 40 mg in the morning and 20 mg in the afternoon) 60 tablet 0  . ibuprofen (ADVIL) 200 MG tablet Take 200 mg by mouth every 6 (six) hours as needed.     History reviewed. No pertinent past medical history.  Past Surgical History:  Procedure Laterality Date  . CESAREAN SECTION    . ESOPHAGOGASTRODUODENOSCOPY N/A 02/21/2020   Procedure: ESOPHAGOGASTRODUODENOSCOPY (EGD);  Surgeon: Midge Minium, MD;  Location[facility] ENDOSCOPY;  Service: Endoscopy;  Laterality: N/A;  . ESOPHAGOGASTRODUODENOSCOPY (EGD) WITH PROPOFOL N/A 04/20/2020   Procedure: ESOPHAGOGASTRODUODENOSCOPY (EGD) WITH PROPOFOL;  Surgeon: Midge Minium, MD;  Location: [facility] ENDOSCOPY;  Service: Endoscopy;  Laterality: N/A;  . VASCULAR SURGERY     Social History Social History   Tobacco Use  . Smoking status: Never Smoker  . Smokeless tobacco: Never Used  Substance Use Topics  . Alcohol use: No  . Drug use: No   Family History Family History  Problem Relation Age of Onset  . Mental illness Mother   Denies family  history of PAD, venous disease, or renal disease  Allergies  Allergen Reactions  . Codeine Nausea Only  . Epinephrine Palpitations   REVIEW OF SYSTEMS (Negative unless checked)  Constitutional: [] Weight loss  [] Fever  [] Chills Cardiac: [] Chest pain   [] Chest pressure   [] Palpitations   [] Shortness of breath when laying flat   [] Shortness of breath at rest   [] Shortness of breath with exertion. Vascular:  [x] Pain in legs with walking   [] Pain in legs at rest   [] Pain in legs when laying flat   [x] Claudication   [x] Pain in feet when walking  [] Pain in feet at rest  [] Pain in feet when laying flat    [] History of DVT   [] Phlebitis   [] Swelling in legs   [] Varicose veins   [] Non-healing ulcers Pulmonary:   [] Uses home oxygen   [] Productive cough   [] Hemoptysis   [] Wheeze  [] COPD   [] Asthma Neurologic:  [] Dizziness  [] Blackouts   [] Seizures   [] History of stroke   [] History of TIA  [] Aphasia   [] Temporary blindness   [] Dysphagia   [] Weakness or numbness in arms   [] Weakness or numbness in legs Musculoskeletal:  [] Arthritis   [] Joint swelling   [] Joint pain   [] Low back pain Hematologic:  [] Easy bruising  [] Easy bleeding   [] Hypercoagulable state   [] Anemic  [] Hepatitis Gastrointestinal:  [] Blood in stool   [] Vomiting blood  [] Gastroesophageal reflux/heartburn   [] Difficulty swallowing. Genitourinary:  [] Chronic kidney disease   [] Difficult urination  [] Frequent urination  [] Burning with urination   [] Blood in urine Skin:  [] Rashes   [] Ulcers   [] Wounds Psychological:  [] History of anxiety   []  History of major depression.  Physical Examination  Vitals:   11/18/20 0945 11/18/20 0946  BP: 138/90   Pulse: 94   Resp: 20   Temp: 98 F (36.7 C)   TempSrc: Oral   SpO2: 99%   Weight:  103.9 kg  Height:  5\' 3"  (1.6 m)   Body mass index is 40.57 kg/m. Gen:  WD/WN, NAD Head: [place]/AT, No temporalis wasting. Prominent temp pulse not noted. Ear/Nose/Throat: Hearing grossly intact, nares w/o erythema or drainage, oropharynx w/o Erythema/Exudate Eyes: Sclera non-icteric, conjunctiva clear Neck: Trachea midline.  No JVD.  Pulmonary:  Good air movement, respirations not labored, equal bilaterally.  Cardiac: RRR, normal S1, S2. Vascular:  Vessel Right Left  Radial Palpable Palpable  Ulnar Palpable Palpable  Brachial Palpable Palpable  Carotid Palpable, without bruit Palpable, without bruit  Aorta Not palpable N/A  Femoral Palpable Palpable  Popliteal Palpable Palpable  PT Non-Palpable Palpable  DP Non-Palpable Palpable   Right lower extremity: Thigh soft.  Calf soft.  Extremity warm  distally to foot then transitions and becomes colder.  To palpate pedal pulses.  Foot with bluish tint.  Motor/sensory intact.  Gastrointestinal: soft, non-tender/non-distended. No guarding/reflex.  Musculoskeletal: M/S 5/5 throughout.  Extremities without ischemic changes.  No deformity or atrophy. No edema. Neurologic: Sensation grossly intact in extremities.  Symmetrical.  Speech is fluent. Motor exam as listed above. Psychiatric: Judgment intact, Mood & affect appropriate for pt's clinical situation. Dermatologic: No rashes or ulcers noted.  No cellulitis or open wounds. Lymph : No Cervical, Axillary, or Inguinal lymphadenopathy.  CBC Lab Results  Component Value Date   WBC 7.0 11/18/2020   HGB 15.2 (H) 11/18/2020   HCT 47.4 (H) 11/18/2020   MCV 86.8 11/18/2020   PLT 249 11/18/2020   BMET    Component Value Date/Time   NA  139 11/18/2020 1016   K 3.8 11/18/2020 1016   CL 104 11/18/2020 1016   CO2 24 11/18/2020 1016   GLUCOSE 114 (H) 11/18/2020 1016   BUN 18 11/18/2020 1016   CREATININE 1.01 (H) 11/18/2020 1016   CREATININE 0.94 11/22/2011 0822   CALCIUM 8.8 (L) 11/18/2020 1016   GFRNONAA >60 11/18/2020 1016   GFRNONAA 69 11/22/2011 0822   GFRAA >60 02/21/2020 0850   GFRAA 80 11/22/2011 0822   Estimated Creatinine Clearance: 65.7 mL/min (A) (by C-G formula based on SCr of 1.01 mg/dL (H)).  COAG Lab Results  Component Value Date   INR 1.0 11/18/2020   Radiology No results found.  Assessment/Plan Karrah Mangini is a 65 year old female with varicose veins, Raynaud's who comes in for issues with her right foot.  Patient states over the past 2 days she has noticed some increasing pain in  1.  Possible ischemia to the right lower extremity: Patient presents with progressively worsening discomfort to the right foot.  Patient does have a formal diagnosis of Raynaud's however notes that the discoloration has never lasted this long.  Her foot is not necessarily painful  however she has been having what sounds like claudication-like symptoms over the last 2 weeks.  In the setting of prolonged discoloration to the patients foot - recommend the patient undergo a right lower extremity angiogram with possible intervention to assess the patient's anatomy and contributing degree of atherosclerotic disease if any and if appropriate an attempt revascularize leg made at that time.  Procedure, risks and benefits were explained to the patient and her family at the bedside.  All questions were answered.  Patient was to proceed.  Will plan on this today.  2.  Raynaud's disease: Patient does have a past medical history of Raynaud's however notes the discoloration to her right toes lasted longer than normal.  Patient is not on medical management for her Raynaud's.  3.  Varicose veins: We have seen the patient in the past for varicose veins. Asymptomatic at this time.  Discussed with Dr. Weldon Inches, PA-C  11/18/2020 12:03 PM    This note was created with Dragon medical transcription system.  Any error is purely unintentional

## 2020-11-18 NOTE — Op Note (Signed)
[facility]  Percutaneous Study/Intervention Procedural Note   Date of Surgery: 11/18/2020  Surgeon(s):Jomarion Mish    Assistants:none  Pre-operative Diagnosis: Ischemic right lower extremity  Post-operative diagnosis:  Same  Procedure(s) Performed:             1.  Ultrasound guidance for vascular access left femoral artery             2.  Catheter placement into right SFA from left femoral approach             3.  Aortogram and selective right lower extremity angiogram             4.   Mechanical thrombectomy of the right popliteal artery, tibioperoneal trunk, and posterior tibial arteries with the penumbra CAT 6 device             5.   Percutaneous transluminal angioplasty of the right tibioperoneal trunk and proximal posterior tibial artery with 3 mm diameter by 10 cm length angioplasty  6.   StarClose closure device left femoral artery  EBL: 250 cc  Contrast: 40 cc  Fluoro Time: 4.4 minutes  Moderate Conscious Sedation Time: approximately 43 minutes using 2 mg of Versed and 50 mcg of Fentanyl              Indications:  Patient is a 65 y.o.female with an acute pulseless, cyanotic right foot over the past 48 hours. The patient is brought in for angiography for further evaluation and potential treatment.  Due to the limb threatening nature of the situation, angiogram was performed for attempted limb salvage. The patient is aware that if the procedure fails, amputation would be expected.  The patient also understands that even with successful revascularization, amputation may still be required due to the severity of the situation.  Risks and benefits are discussed and informed consent is obtained.   Procedure:  The patient was identified and appropriate procedural time out was performed.  The patient was then placed supine on the table and prepped and draped in the usual sterile fashion. Moderate conscious sedation was administered during a face to face encounter  with the patient throughout the procedure with my supervision of the RN administering medicines and monitoring the patient's vital signs, pulse oximetry, telemetry and mental status throughout from the start of the procedure until the patient was taken to the recovery room. Ultrasound was used to evaluate the left common femoral artery.  It was patent .  A digital ultrasound image was acquired.  A Seldinger needle was used to access the left common femoral artery under direct ultrasound guidance and a permanent image was performed.  A 0.035 J wire was advanced without resistance and a 5Fr sheath was placed.  Pigtail catheter was placed into the aorta and an AP aortogram was performed. This demonstrated normal renal arteries and normal aorta and iliac segments without significant stenosis. I then crossed the aortic bifurcation and advanced to the right femoral head and then into the proximal right SFA. Selective right lower extremity angiogram was then performed. This demonstrated normal common femoral artery, profunda femoris artery, and superficial femoral artery.  There was an abrupt occlusion of the popliteal artery at the level of the knee.  There was occlusion of all 3 tibial vessels in the proximal segment with reconstitution of all 3 tibial vessels in the middle segment. It was felt that it was in the patient's best interest to proceed with intervention after these images to avoid  a second procedure and a larger amount of contrast and fluoroscopy based off of the findings from the initial angiogram. The patient was systemically heparinized and a 6 [place] destination sheath was then placed over the [employer] wire. I then used a Kumpe catheter and the advantage wire to easily navigate down into the occlusion.  I then delivered 6 mg of TPA to the Kumpe catheter in the right popliteal artery, tibioperoneal trunk, and proximal posterior tibial arteries.  Then exchanged for a V 18 wire and proceeded with  mechanical thrombectomy.  2 passes were made in the right popliteal artery, tibioperoneal trunk, and posterior tibial arteries with the penumbra CAT 6 device performing mechanical thrombectomy.  A significant amount of thrombus was removed.  The popliteal artery was cleared and there remained some thrombus and stenosis in the proximal portion of the posterior tibial artery and the tibioperoneal trunk.  I then treated that with a 3 mm diameter by 10 cm length angioplasty balloon inflated in the proximal right posterior tibial artery and tibioperoneal trunk inflating this to 12 atm for 1 minute.  Completion imaging showed less than 20% residual stenosis with a patent posterior tibial artery into the foot.  There was spasm distally.  The peroneal artery appeared to occlude distally but provided a second runoff vessel.  Although the anterior tibial artery remained occluded, I felt that any attempt to try to open this may worsen the areas we had already treated and worsen her ischemia. I elected to terminate the procedure. The sheath was removed and StarClose closure device was deployed in the left femoral artery with excellent hemostatic result. The patient was taken to the recovery room in stable condition having tolerated the procedure well.  Findings:               Aortogram:  Normal renal arteries, normal aorta and iliac arteries without significant stenosis             Right lower Extremity:  Normal common femoral artery, profunda femoris artery, and superficial femoral artery.  There was an abrupt occlusion of the popliteal artery at the level of the knee.  There was occlusion of all 3 tibial vessels in the proximal segment with reconstitution of all 3 tibial vessels in the middle segment.   Disposition: Patient was taken to the recovery room in stable condition having tolerated the procedure well.  Complications: None  Leotis Pain 11/18/2020 3:13 PM   This note was created with Dragon Medical  transcription system. Any errors in dictation are purely unintentional.

## 2020-11-19 ENCOUNTER — Encounter: Payer: Self-pay | Admitting: Vascular Surgery

## 2020-11-19 DIAGNOSIS — M79671 Pain in right foot: Secondary | ICD-10-CM | POA: Diagnosis not present

## 2020-11-19 LAB — CBC
HCT: 40.6 % (ref 36.0–46.0)
Hemoglobin: 13.2 g/dL (ref 12.0–15.0)
MCH: 28.3 pg (ref 26.0–34.0)
MCHC: 32.5 g/dL (ref 30.0–36.0)
MCV: 86.9 fL (ref 80.0–100.0)
Platelets: 240 10*3/uL (ref 150–400)
RBC: 4.67 MIL/uL (ref 3.87–5.11)
RDW: 14 % (ref 11.5–15.5)
WBC: 8 10*3/uL (ref 4.0–10.5)
nRBC: 0 % (ref 0.0–0.2)

## 2020-11-19 LAB — PROTIME-INR
INR: 1.1 (ref 0.8–1.2)
Prothrombin Time: 13.3 seconds (ref 11.4–15.2)

## 2020-11-19 LAB — BASIC METABOLIC PANEL
Anion gap: 6 (ref 5–15)
BUN: 14 mg/dL (ref 8–23)
CO2: 22 mmol/L (ref 22–32)
Calcium: 8.2 mg/dL — ABNORMAL LOW (ref 8.9–10.3)
Chloride: 110 mmol/L (ref 98–111)
Creatinine, Ser: 0.85 mg/dL (ref 0.44–1.00)
GFR, Estimated: >60 mL/min (ref >60)
Glucose, Bld: 98 mg/dL (ref 70–99)
Potassium: 3.7 mmol/L (ref 3.5–5.1)
Sodium: 138 mmol/L (ref 135–145)

## 2020-11-19 LAB — APTT: aPTT: 27 seconds (ref 24–36)

## 2020-11-19 LAB — HIV ANTIBODY (ROUTINE TESTING W REFLEX): HIV Screen 4th Generation wRfx: NONREACTIVE

## 2020-11-19 MED ORDER — ATORVASTATIN CALCIUM 10 MG PO TABS: 10.0000 mg | ORAL_TABLET | Freq: Every day | ORAL | 2 refills | Status: AC

## 2020-11-19 MED ORDER — ASPIRIN EC 81 MG PO TBEC
81.0000 mg | DELAYED_RELEASE_TABLET | Freq: Every day | ORAL | Status: DC
  Administered 2020-11-19: 81 mg via ORAL
  Filled 2020-11-19: qty 1

## 2020-11-19 MED ORDER — ASPIRIN 81 MG PO TBEC
81.0000 mg | DELAYED_RELEASE_TABLET | Freq: Every day | ORAL | Status: AC
Start: 2020-11-20 — End: ?

## 2020-11-19 MED ORDER — PANTOPRAZOLE SODIUM 40 MG PO TBEC: 40.0000 mg | DELAYED_RELEASE_TABLET | Freq: Every day | ORAL | Status: DC

## 2020-11-19 MED ORDER — APIXABAN 5 MG PO TABS
5.0000 mg | ORAL_TABLET | Freq: Two times a day (BID) | ORAL | Status: DC
  Administered 2020-11-19: 5 mg via ORAL
  Filled 2020-11-19: qty 1

## 2020-11-19 MED ORDER — APIXABAN 5 MG PO TABS
5.0000 mg | ORAL_TABLET | Freq: Two times a day (BID) | ORAL | 2 refills | Status: DC
Start: 2020-11-19 — End: 2021-10-11

## 2020-11-19 MED ORDER — ATORVASTATIN CALCIUM 10 MG PO TABS
10.0000 mg | ORAL_TABLET | Freq: Every day | ORAL | Status: DC
  Administered 2020-11-19: 10 mg via ORAL
  Filled 2020-11-19: qty 1

## 2020-11-19 NOTE — Discharge Instructions (Signed)
WHAT DO YOU NEED TO KNOW ABOUT ELIQUIS ? The starting dose ONE 5 mg tablet taken TWICE daily. Eliquis may be taken with or without food. Try to take the dose about the same time in the morning and in the evening. If you have difficulty swallowing the tablet whole please discuss with your pharmacist how to take the medication safely. Take Eliquis exactly as prescribed and DO NOT stop taking Eliquis without talking to the doctor who prescribed the medication. Stopping may increase your risk of developing a new blood clot. Refill your prescription before you run out. After discharge, you should have regular check-up appointments with your healthcare provider that is prescribing your Eliquis. WHAT DO YOU DO IF YOU MISS A DOSE? If a dose of ELIQUIS is not taken at the scheduled time, take it as soon as possible on the same day and twice-daily administration should be resumed. The dose should not be doubled to make up for a missed dose. IMPORTANT SAFETY INFORMATION A possible side effect of Eliquis is bleeding. You should call your healthcare provider right away if you experience any of the following: ? Bleeding from an injury or your nose that does not stop. ? Unusual colored urine (red or dark brown) or unusual colored stools (red or black). ? Unusual bruising for unknown reasons. ? A serious fall or if you hit your head (even if there is no bleeding). Some medicines may interact with Eliquis and might increase your risk of bleeding or clotting while on Eliquis. To help avoid this, consult your healthcare provider or pharmacist prior to using any new prescription or non-prescription medications, including herbals, vitamins, non-steroidal anti-inflammatory drugs (NSAIDs) and supplements. This website has more information on Eliquis (apixaban): [email or web address].cy.

## 2020-11-19 NOTE — Progress Notes (Signed)
Discharged per orders. VSS. Patient adequate for discharge.

## 2020-11-19 NOTE — Discharge Summary (Signed)
Physician Discharge Summary   RISE [name]  female DOB: June 04, 1956  YWV:[id number]  PCP: Rayetta Humphrey, MD  Admit date: 11/18/2020 Discharge date: 11/19/2020  Admitted From: home Disposition:  home CODE STATUS: Full code   Hospital Course:  For full details, please see H&P, progress notes, consult notes and ancillary notes.  Briefly,  Jade Edwards is a 65 y.o. female with medical history significant for raynaud's syndrome, acid reflux, presented emergency department for chief concerns of right foot pain.  Acute right foot pain secondary to ischemic limb S/p angiogram and revascularization of the right lower extremity. Pt was started on heparin GTT.  Vascular performed angiogram, thrombectomy and angioplasty resulting in successful revascularization.  Pt was started on aspirin, Eliquis and statin for medical management.  Pt will follow up with vascular surgery 1 month after discharge.  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 What changed:   how much to take  how to take this  when to take this  additional instructions   apixaban 5 MG Tabs tablet Commonly known as: ELIQUIS Take 1 tablet (5 mg total) by mouth 2 (two) times daily.   aspirin 81 MG EC tablet Take 1 tablet (81 mg total) by mouth daily. Swallow whole. Start taking on: November 20, 2020   atorvastatin 10 MG tablet Commonly known as: LIPITOR Take 1 tablet (10 mg total) by mouth daily. Start taking on: November 20, 2020   ibuprofen 200 MG tablet Commonly known as: ADVIL Take 200 mg by mouth every 6 (six) hours as needed.        Follow-up Information    Rayetta Humphrey, MD. Schedule an appointment as soon as possible for a visit in 1 week(s).   Specialty: Family Medicine Contact information: [address] [place] [address] [phone]               Allergies  Allergen Reactions  . Codeine Nausea Only  . Epinephrine Palpitations     The results of significant diagnostics from this hospitalization (including imaging, microbiology, ancillary and laboratory) are listed below for reference.   Consultations:   Procedures/Studies: PERIPHERAL VASCULAR CATHETERIZATION  Result Date: 11/18/2020 See op note     Labs: BNP (last 3 results) No results for input(s): BNP in the last 8760 hours. Basic Metabolic Panel: Recent Labs  Lab 11/18/20 1016 11/19/20 0349  NA 139 138  K 3.8 3.7  CL 104 110  CO2 24 22  GLUCOSE 114* 98  BUN 18 14  CREATININE 1.01* 0.85  CALCIUM 8.8* 8.2*   Liver Function Tests: Recent Labs  Lab 11/18/20 1016  AST 23  ALT 19  ALKPHOS 99  BILITOT 0.5  PROT 7.3  ALBUMIN 3.8   No results for input(s): LIPASE, AMYLASE in the last 168 hours. No results for input(s): AMMONIA in the last 168  hours. CBC: Recent Labs  Lab 11/18/20 1016 11/19/20 0349  WBC 7.0 8.0  NEUTROABS 4.7  --   HGB 15.2* 13.2  HCT 47.4* 40.6  MCV 86.8 86.9  PLT 249 240   Cardiac Enzymes: No results for input(s): CKTOTAL, CKMB, CKMBINDEX, TROPONINI in the last 168 hours. BNP: Invalid input(s): POCBNP CBG: No results for input(s): GLUCAP in the last 168 hours. D-Dimer No results for input(s): DDIMER in the last 72 hours. Hgb A1c No results for input(s): HGBA1C in the last 72 hours. Lipid Profile No results for input(s): CHOL,  HDL, LDLCALC, TRIG, CHOLHDL, LDLDIRECT in the last 72 hours. Thyroid function studies No results for input(s): TSH, T4TOTAL, T3FREE, THYROIDAB in the last 72 hours.  Invalid input(s): FREET3 Anemia work up No results for input(s): VITAMINB12, FOLATE, FERRITIN, TIBC, IRON, RETICCTPCT in the last 72 hours. Urinalysis No results found for: COLORURINE, APPEARANCEUR, LABSPEC, PHURINE, GLUCOSEU, HGBUR, BILIRUBINUR, KETONESUR, PROTEINUR, UROBILINOGEN, NITRITE, LEUKOCYTESUR Sepsis Labs Invalid input(s): PROCALCITONIN,  WBC,  LACTICIDVEN Microbiology Recent Results (from the past 240 hour(s))  Resp Panel by RT-PCR (Flu A&B, Covid) Nasopharyngeal Swab     Status: None   Collection Time: 11/18/20 10:17 AM   Specimen: Nasopharyngeal Swab; Nasopharyngeal(NP) swabs in vial transport medium  Result Value Ref Range Status   SARS Coronavirus 2 by RT PCR NEGATIVE NEGATIVE Final    Comment: (NOTE) SARS-CoV-2 target nucleic acids are NOT DETECTED.  The SARS-CoV-2 RNA is generally detectable in upper respiratory specimens during the acute phase of infection. The lowest concentration of SARS-CoV-2 viral copies this assay can detect is 138 copies/mL. A negative result does not preclude SARS-Cov-2 infection and should not be used as the sole basis for treatment or other patient management decisions. A negative result may occur with  improper specimen collection/handling, submission of specimen other than nasopharyngeal swab, presence of viral mutation(s) within the areas targeted by this assay, and inadequate number of viral copies(<138 copies/mL). A negative result must be combined with clinical observations, patient history, and epidemiological information. The expected result is Negative.  Fact Sheet for Patients:  BloggerCourse.com  Fact Sheet for Healthcare Providers:  SeriousBroker.it  This test is no t yet approved or cleared by the [place] FDA and  has been authorized for detection and/or diagnosis of SARS-CoV-2 by FDA under an Emergency Use Authorization (EUA). This EUA will remain  in effect (meaning this test can be used) for the duration of the COVID-19 declaration under Section 564(b)(1) of the Act, 21 U.S.C.section 360bbb-3(b)(1), unless the authorization is terminated  or revoked sooner.       Influenza A by PCR NEGATIVE NEGATIVE Final   Influenza B by PCR NEGATIVE NEGATIVE Final    Comment: (NOTE) The Xpert Xpress SARS-CoV-2/FLU/RSV plus assay is intended as an aid in the diagnosis of influenza from Nasopharyngeal swab specimens and should not be used as a sole basis for treatment. Nasal washings and aspirates are unacceptable for Xpert Xpress SARS-CoV-2/FLU/RSV testing.  Fact Sheet for Patients: BloggerCourse.com  Fact Sheet for Healthcare Providers: SeriousBroker.it  This test is not yet approved or cleared by the [place] FDA and has been authorized for detection and/or diagnosis of SARS-CoV-2 by FDA under an Emergency Use Authorization (EUA). This EUA will remain in effect (meaning this test can be used) for the duration of the COVID-19 declaration under Section 564(b)(1) of the Act, 21 U.S.C. section 360bbb-3(b)(1), unless the authorization is terminated or revoked.  Performed at [facility], [address] [place], [place] [address]      Total time spend on discharging this patient, including the last patient exam, discussing the hospital stay, instructions for ongoing care as it relates to all pertinent caregivers, as well as preparing the medical discharge records, prescriptions, and/or referrals as applicable, is 35 minutes.    Darlin Priestly, MD  [facility] 11/19/2020, 12:16 PM

## 2020-11-19 NOTE — Progress Notes (Signed)
 [facility] Daily Progress Note  Subjective: 11/18/20: 1. Ultrasound guidance for vascular access left femoral artery 2. Catheter placement into right SFA from left femoral approach 3. Aortogram and selective right lower extremity angiogram 4.  Mechanical thrombectomy of the right popliteal artery, tibioperoneal trunk, and posterior tibial arteries with the penumbra CAT 6 device 5.  Percutaneous transluminal angioplasty of the right tibioperoneal trunk and proximal posterior tibial artery with 3 mm diameter by 10 cm length angioplasty             6.  StarClose closure device left femoral artery  Patient was without complaint this afternoon.  States the discomfort to her right lower extremity has improved.  Denies any further discoloration.  Objective: Vitals:   11/18/20 2032 11/19/20 0509 11/19/20 0758 11/19/20 1144  BP: 138/74 (!) 148/70 133/79 (!) 152/66  Pulse: 80 64 67 67  Resp: 20 20 19    Temp: 98.6 F (37 C) 97.9 F (36.6 C) 97.9 F (36.6 C) 97.9 F (36.6 C)  TempSrc: Oral Oral Oral Oral  SpO2: 98% 95% 99% 99%  Weight:      Height:        Intake/Output Summary (Last 24 hours) at 11/19/2020 1352 Last data filed at 11/19/2020 0950 Gross per 24 hour  Intake 1413.85 ml  Output --  Net 1413.85 ml   Physical Exam: A&Ox3, NAD CV: RRR Pulmonary: CTA Bilaterally Abdomen: Soft, Nontender, Nondistended Vascular:  Right lower extremity: Thigh soft.  Calf soft.  Extremities warm distally toes.  No discoloration is noted to the right foot.  Palpable pedal pulses.  Good capillary refill.  Motor/sensory is intact   Laboratory: CBC    Component Value Date/Time   WBC 8.0 11/19/2020 0349   HGB 13.2 11/19/2020 0349   HCT 40.6 11/19/2020 0349   PLT 240 11/19/2020 0349   BMET    Component Value Date/Time   NA 138 11/19/2020 0349   K 3.7 11/19/2020 0349   CL 110 11/19/2020 0349   CO2 22  11/19/2020 0349   GLUCOSE 98 11/19/2020 0349   BUN 14 11/19/2020 0349   CREATININE 0.85 11/19/2020 0349   CREATININE 0.94 11/22/2011 0822   CALCIUM 8.2 (L) 11/19/2020 0349   GFRNONAA >60 11/19/2020 0349   GFRNONAA 69 11/22/2011 0822   GFRAA >60 02/21/2020 0850   GFRAA 80 11/22/2011 0822   Assessment/Planning: The patient is a 65 year old female who presented with right lower extremity ischemia status post right lower extremity angiogram with intervention  1) successful revascularization of the right lower extremity.  Patient notes an improvement in the discoloration of her foot.  There is no discomfort. 2) started the patient on aspirin, Eliquis and statin for medical management 3) we will see the patient back in clinic in approximately 1 month to continue to surveilled her disease 4) okay from a vascular standpoint to discharge home  Discussed with Dr. 65 Aisa Schoeppner PA-C 11/19/2020 1:52 PM

## 2020-11-19 NOTE — TOC Transition Note (Signed)
Transition of Care [facility]) - CM/SW Discharge Note   Patient Details  Name: Jade Edwards MRN: [id number] Date of Birth: 17-Aug-1956  Transition of Care [facility]) CM/SW Contact:  Hetty Ely, RN Phone Number: 11/19/2020, 12:25 PM   Clinical Narrative:  Spoke with patient, who says she will be discharged today, have no needs. Family support available at all times, son is a Radiation protection practitioner and his girlfriend is a Nutritional therapist here. Eliquis coupon given to patient with counseling by Pharmacy team. No TOC needs identified.           Patient Goals and CMS Choice        Discharge Placement                       Discharge Plan and Services                                     Social Determinants of Health (SDOH) Interventions     Readmission Risk Interventions No flowsheet data found.

## 2020-12-15 ENCOUNTER — Encounter: Payer: Self-pay | Admitting: *Deleted

## 2020-12-15 ENCOUNTER — Inpatient Hospital Stay: Payer: BC Managed Care – PPO | Attending: Internal Medicine | Admitting: Internal Medicine

## 2020-12-15 ENCOUNTER — Other Ambulatory Visit (INDEPENDENT_AMBULATORY_CARE_PROVIDER_SITE_OTHER): Payer: Self-pay | Admitting: Vascular Surgery

## 2020-12-15 ENCOUNTER — Other Ambulatory Visit: Payer: Self-pay

## 2020-12-15 ENCOUNTER — Inpatient Hospital Stay: Payer: BC Managed Care – PPO

## 2020-12-15 VITALS — BP 157/90 | HR 77 | Temp 97.6°F | Resp 20 | Ht 63.0 in | Wt 234.7 lb

## 2020-12-15 DIAGNOSIS — I739 Peripheral vascular disease, unspecified: Secondary | ICD-10-CM

## 2020-12-15 DIAGNOSIS — I7300 Raynaud's syndrome without gangrene: Secondary | ICD-10-CM | POA: Diagnosis not present

## 2020-12-15 DIAGNOSIS — I771 Stricture of artery: Secondary | ICD-10-CM | POA: Diagnosis not present

## 2020-12-15 DIAGNOSIS — D6859 Other primary thrombophilia: Secondary | ICD-10-CM

## 2020-12-15 DIAGNOSIS — Z832 Family history of diseases of the blood and blood-forming organs and certain disorders involving the immune mechanism: Secondary | ICD-10-CM

## 2020-12-15 DIAGNOSIS — Z7901 Long term (current) use of anticoagulants: Secondary | ICD-10-CM | POA: Diagnosis not present

## 2020-12-15 DIAGNOSIS — Z9862 Peripheral vascular angioplasty status: Secondary | ICD-10-CM

## 2020-12-15 DIAGNOSIS — Z803 Family history of malignant neoplasm of breast: Secondary | ICD-10-CM

## 2020-12-15 LAB — CBC WITH DIFFERENTIAL/PLATELET
Abs Immature Granulocytes: 0.03 10*3/uL (ref 0.00–0.07)
Basophils Absolute: 0.1 10*3/uL (ref 0.0–0.1)
Basophils Relative: 1 %
Eosinophils Absolute: 0.2 10*3/uL (ref 0.0–0.5)
Eosinophils Relative: 3 %
HCT: 44.9 % (ref 36.0–46.0)
Hemoglobin: 14.3 g/dL (ref 12.0–15.0)
Immature Granulocytes: 0 %
Lymphocytes Relative: 28 %
Lymphs Abs: 2.5 10*3/uL (ref 0.7–4.0)
MCH: 28.3 pg (ref 26.0–34.0)
MCHC: 31.8 g/dL (ref 30.0–36.0)
MCV: 88.7 fL (ref 80.0–100.0)
Monocytes Absolute: 0.4 10*3/uL (ref 0.1–1.0)
Monocytes Relative: 5 %
Neutro Abs: 5.6 10*3/uL (ref 1.7–7.7)
Neutrophils Relative %: 63 %
Platelets: 288 10*3/uL (ref 150–400)
RBC: 5.06 MIL/uL (ref 3.87–5.11)
RDW: 14.3 % (ref 11.5–15.5)
WBC: 8.8 10*3/uL (ref 4.0–10.5)
nRBC: 0 % (ref 0.0–0.2)

## 2020-12-15 LAB — ANTITHROMBIN III: AntiThromb III Func: 129 % — ABNORMAL HIGH (ref 75–120)

## 2020-12-15 LAB — COMPREHENSIVE METABOLIC PANEL
ALT: 25 U/L (ref 0–44)
AST: 22 U/L (ref 15–41)
Albumin: 4.2 g/dL (ref 3.5–5.0)
Alkaline Phosphatase: 105 U/L (ref 38–126)
Anion gap: 11 (ref 5–15)
BUN: 14 mg/dL (ref 8–23)
CO2: 27 mmol/L (ref 22–32)
Calcium: 8.8 mg/dL — ABNORMAL LOW (ref 8.9–10.3)
Chloride: 101 mmol/L (ref 98–111)
Creatinine, Ser: 0.91 mg/dL (ref 0.44–1.00)
GFR, Estimated: >60 mL/min (ref >60)
Glucose, Bld: 93 mg/dL (ref 70–99)
Potassium: 3.8 mmol/L (ref 3.5–5.1)
Sodium: 139 mmol/L (ref 135–145)
Total Bilirubin: 0.4 mg/dL (ref 0.3–1.2)
Total Protein: 7.6 g/dL (ref 6.5–8.1)

## 2020-12-15 NOTE — Progress Notes (Signed)
[facility] CONSULT NOTE  Patient Care Team: Rayetta Humphrey, MD as PCP - General (Family Medicine)  CHIEF COMPLAINTS/PURPOSE OF CONSULTATION: DVT/PE  # April 2022- Right aretrial clot- Dr.Dew/Dr.Arida Cath/angrio- Normal common femoral artery, profunda femoris artery, and superficial femoral artery.  There was an abrupt occlusion of the popliteal artery at the level of the knee.  There was occlusion of all 3 tibial vessels in the proximal segment with reconstitution of all 3 tibial vessels in the middle segment.; s/p mechanical thrombectomy of right popliteal artery, tibioperoneal trunk, and posterior tibial arteries; Percutaneous transluminal angioplasty of the right tibioperoneal trunk and proximal posterior tibial artery; on ELIQUIS   # COVID end of JAN, 2022 [post vacination]  Oncology History   No history exists.     HISTORY OF PRESENTING ILLNESS:  Jade Edwards 65 y.o.  female patient with history of Raynaud's was recently evaluated in the emergency room for absent pulses in the right lower extremity-it was accompanied by significant pain/and increasing tingling numbness sensation.  Patient underwent urgent evaluation with vascular surgery-underwent angiogram-showed apparent occlusion of the popliteal artery; also occlusion of the 3 tibial vessels.  Patient underwent thrombectomy; started on Eliquis.  Patient notes to improvement of the pain and tingling and numbness.  However continues to have dusky discoloration [chronic] history of Raynaud's.  Patient does not have a history of heart disease/or A. Fib; no history of any smoking or prior history of peripheral vascular disease.  No prior history of DVT PE. Previous history of DVT/PE: none  Family history: sister/ aunt [mom's sister]- with hx of DVTs  Review of Systems  Constitutional: Negative for chills, diaphoresis, fever, malaise/fatigue and weight loss.  HENT: Negative for nosebleeds and sore throat.    Eyes: Negative for double vision.  Respiratory: Negative for cough, hemoptysis, sputum production, shortness of breath and wheezing.   Cardiovascular: Negative for chest pain, palpitations, orthopnea and leg swelling.  Gastrointestinal: Negative for abdominal pain, blood in stool, constipation, diarrhea, heartburn, melena, nausea and vomiting.  Genitourinary: Negative for dysuria, frequency and urgency.  Musculoskeletal: Positive for back pain and joint pain.  Skin: Negative.  Negative for itching and rash.  Neurological: Negative for dizziness, tingling, focal weakness, weakness and headaches.  Endo/Heme/Allergies: Does not bruise/bleed easily.  Psychiatric/Behavioral: Negative for depression. The patient is not nervous/anxious and does not have insomnia.      MEDICAL HISTORY:  Past Medical History:  Diagnosis Date  . Anti-cyclic citrullinated peptide antibody positive   . 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 Dysphagia   . Easy bruising   . Family history of blood clots   . History of COVID-19   . Microalbuminuria   . Mitral valve prolapse   . Obesity   . Osteoarthritis of right knee   . Primary osteoarthritis of both knees   . Raynaud's disease without gangrene   . Rheumatoid factor positive   . Stricture and stenosis of esophagus     SURGICAL HISTORY: Past Surgical History:  Procedure Laterality Date  . CESAREAN SECTION    . ESOPHAGOGASTRODUODENOSCOPY N/A 02/21/2020   Procedure: ESOPHAGOGASTRODUODENOSCOPY (EGD);  Surgeon: Midge Minium, MD;  Location: [facility] ENDOSCOPY;  Service: Endoscopy;  Laterality: N/A;  . ESOPHAGOGASTRODUODENOSCOPY (EGD) WITH PROPOFOL N/A 04/20/2020   Procedure: ESOPHAGOGASTRODUODENOSCOPY (EGD) WITH PROPOFOL;  Surgeon: Midge Minium, MD;  Location: [facility] ENDOSCOPY;  Service: Endoscopy;  Laterality: N/A;  . LOWER EXTREMITY ANGIOGRAPHY  Right 11/18/2020   Procedure: Lower  Extremity Angiography;  Surgeon: Annice Needy, MD;  Location: [facility] INVASIVE CV LAB;  Service: Cardiovascular;  Laterality: Right;  [facility] VASCULAR SURGERY      SOCIAL HISTORY: Social History   Socioeconomic History  . Marital status: Married    Spouse name: Not on file  . Number of children: Not on file  . Years of education: Not on file  . Highest education level: Not on file  Occupational History  . Not on file  Tobacco Use  . Smoking status: Never Smoker  . Smokeless tobacco: Never Used  Substance and Sexual Activity  . Alcohol use: No  . Drug use: No  . Sexual activity: Not on file  Other Topics Concern  . Not on file  Social History Narrative   No smoking; no alcohol. 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 Lives [place].    Social Determinants of Health   Financial Resource Strain: Not on file  Food Insecurity: Not on file  Transportation Needs: Not on file  Physical Activity: Not on file  Stress: Not on file  Social Connections: Not on file  Intimate Partner Violence: Not on file    FAMILY HISTORY: Family History  Problem Relation Age of Onset  . Mental illness Mother   . Bipolar disorder Mother   . Breast cancer Mother   . Depression Mother   . Other Mother        DVTs  . Lupus Mother   . Hyperlipidemia Mother   . Rheum arthritis Mother   . Stroke Mother   . Coronary artery disease Father   . Osteoarthritis Daughter   . Osteoarthritis Son   . Other Sister        blood clots    ALLERGIES:  is allergic to codeine and epinephrine.  MEDICATIONS:  Current Outpatient Medications  Medication Sig Dispense Refill  . amphetamine-dextroamphetamine (ADDERALL) 20 MG tablet Take one tablet in the morning and one tablet in the afternoon. (Patient taking differently: Take by mouth See admin instructions. Take 40 mg in the morning and 20 mg in the afternoon) 60 tablet 0  . apixaban (ELIQUIS) 5 MG TABS tablet Take 1 tablet (5 mg total)  by mouth 2 (two) times daily. 60 tablet 2  . aspirin EC 81 MG EC tablet Take 1 tablet (81 mg total) by mouth daily. Swallow whole.    Marland Kitchen atorvastatin (LIPITOR) 10 MG tablet Take 1 tablet (10 mg total) by mouth daily. 30 tablet 2   No current facility-administered medications for this visit.      [facility] Kitchen  PHYSICAL EXAMINATION:  Vitals:   12/15/20 1127  BP: (!) 157/90  Pulse: 77  Resp: 20  Temp: 97.6 F (36.4 C)   Filed Weights   12/15/20 1127  Weight: 234 lb 11.2 oz (106.5 kg)    Physical Exam HENT:     Head: Normocephalic and atraumatic.     Mouth/Throat:     Pharynx: No oropharyngeal exudate.  Eyes:     Pupils: Pupils are equal, round, and reactive to light.  Cardiovascular:     Rate and Rhythm: Normal rate and regular rhythm.  Pulmonary:     Effort: Pulmonary effort is normal. No respiratory distress.     Breath sounds: Normal breath sounds. No wheezing.  Abdominal:     General: Bowel sounds are normal. There is no distension.     Palpations: Abdomen is soft. There is  no mass.     Tenderness: There is no abdominal tenderness. There is no guarding or rebound.  Musculoskeletal:        General: No tenderness. Normal range of motion.     Cervical back: Normal range of motion and neck supple.  Skin:    General: Skin is warm.     Comments: Dusky discoloration of the bilateral toes; right more than left.  Feeble pulses felt in the right dorsalis pedis/posterior tibial  Neurological:     Mental Status: She is alert and oriented to person, place, and time.  Psychiatric:        Mood and Affect: Affect normal.      LABORATORY DATA:  I have reviewed the data as listed Lab Results  Component Value Date   WBC 8.0 11/19/2020   HGB 13.2 11/19/2020   HCT 40.6 11/19/2020   MCV 86.9 11/19/2020   PLT 240 11/19/2020   Recent Labs    02/21/20 0850 11/18/20 1016 11/19/20 0349  NA 138 139 138  K 4.0 3.8 3.7  CL 103 104 110  CO2 22 24 22   GLUCOSE 97 114* 98  BUN 16 18 14    CREATININE 0.87 1.01* 0.85  CALCIUM 8.9 8.8* 8.2*  GFRNONAA >60 >60 >60  GFRAA >60  --   --   PROT 7.6 7.3  --   ALBUMIN 4.0 3.8  --   AST 26 23  --   ALT 18 19  --   ALKPHOS 116 99  --   BILITOT 0.9 0.5  --   BILIDIR  --  0.1  --   IBILI  --  0.4  --     RADIOGRAPHIC STUDIES: I have personally reviewed the radiological images as listed and agreed with the findings in the report. 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  S/p thrombectomy/stent placement currently on Eliquis.  Discussed with patient that she will be on anticoagulation for at least 6 months.  #Etiology: The etiology is unclear given arterial thrombosis-important to rule out cardiac causes.  Awaiting cardiology evaluation.  Recommend female causes of thrombosis [usually cause venous thrombosis]; also check anticardiolipin antibody/beta-2 glycoprotein antibody.   #History of Raynaud's; awaiting rheumatology evaluation.  Thank you. for allowing me to participate in the care of your pleasant patient. Please do not hesitate to contact me with questions or concerns in the interim.  # DISPOSITION: will call  # labs today # follow up in 3 months- MD; labs- cbc/cmp-Dr.B  Questions were answered. The patient knows to call the clinic with any problems, questions or concerns.    , MD 12/15/2020 12:43 PM

## 2020-12-15 NOTE — Assessment & Plan Note (Signed)
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  S/p thrombectomy/stent placement currently on Eliquis.  Discussed with patient that she will be on anticoagulation for at least 6 months.  #Etiology: The etiology is unclear given arterial thrombosis-important to rule out cardiac causes.  Awaiting cardiology evaluation.  Recommend female causes of thrombosis [usually cause venous thrombosis]; also check anticardiolipin antibody/beta-2 glycoprotein antibody.   #History of Raynaud's; awaiting rheumatology evaluation.  Thank you. for allowing me to participate in the care of your pleasant patient. Please do not hesitate to contact me with questions or concerns in the interim.  # DISPOSITION: will call  # labs today # follow up in 3 months- MD; labs- cbc/cmp-Dr.B

## 2020-12-16 LAB — BETA-2-GLYCOPROTEIN I ABS, IGG/M/A
Beta-2 Glyco I IgG: <9 GPI IgG units (ref 0–20)
Beta-2-Glycoprotein I IgA: <9 GPI IgA units (ref 0–25)
Beta-2-Glycoprotein I IgM: 15 GPI IgM units (ref 0–32)

## 2020-12-16 LAB — PROTEIN S, TOTAL: Protein S Ag, Total: 125 % (ref 60–150)

## 2020-12-17 ENCOUNTER — Encounter (INDEPENDENT_AMBULATORY_CARE_PROVIDER_SITE_OTHER): Payer: Self-pay | Admitting: Vascular Surgery

## 2020-12-17 ENCOUNTER — Ambulatory Visit (INDEPENDENT_AMBULATORY_CARE_PROVIDER_SITE_OTHER): Payer: BC Managed Care – PPO | Admitting: Vascular Surgery

## 2020-12-17 ENCOUNTER — Ambulatory Visit (INDEPENDENT_AMBULATORY_CARE_PROVIDER_SITE_OTHER): Payer: BC Managed Care – PPO

## 2020-12-17 ENCOUNTER — Other Ambulatory Visit: Payer: Self-pay

## 2020-12-17 ENCOUNTER — Telehealth (INDEPENDENT_AMBULATORY_CARE_PROVIDER_SITE_OTHER): Payer: Self-pay

## 2020-12-17 VITALS — BP 162/90 | HR 69 | Resp 16 | Wt 236.8 lb

## 2020-12-17 DIAGNOSIS — I739 Peripheral vascular disease, unspecified: Secondary | ICD-10-CM

## 2020-12-17 DIAGNOSIS — Z9862 Peripheral vascular angioplasty status: Secondary | ICD-10-CM

## 2020-12-17 DIAGNOSIS — M25569 Pain in unspecified knee: Secondary | ICD-10-CM | POA: Insufficient documentation

## 2020-12-17 DIAGNOSIS — I83811 Varicose veins of right lower extremities with pain: Secondary | ICD-10-CM | POA: Diagnosis not present

## 2020-12-17 DIAGNOSIS — I7300 Raynaud's syndrome without gangrene: Secondary | ICD-10-CM | POA: Diagnosis not present

## 2020-12-17 DIAGNOSIS — S8390XA Sprain of unspecified site of unspecified knee, initial encounter: Secondary | ICD-10-CM | POA: Insufficient documentation

## 2020-12-17 DIAGNOSIS — I341 Nonrheumatic mitral (valve) prolapse: Secondary | ICD-10-CM | POA: Insufficient documentation

## 2020-12-17 LAB — PROTEIN C, TOTAL: Protein C, Total: 164 % — ABNORMAL HIGH (ref 60–150)

## 2020-12-17 LAB — CARDIOLIPIN ANTIBODIES, IGG, IGM, IGA
Anticardiolipin IgA: <9 APL U/mL (ref 0–11)
Anticardiolipin IgG: <9 GPL U/mL (ref 0–14)
Anticardiolipin IgM: <9 MPL U/mL (ref 0–12)

## 2020-12-17 NOTE — Progress Notes (Signed)
MRN : [id number]  Jade Edwards is a 65 y.o. (1956/08/21) female who presents with chief complaint of  Chief Complaint  Patient presents with  . Follow-up    1 month ischemic follow up  .  History of Present Illness: Patient returns today in follow up of revascularization from her ischemic leg. About a month ago, she presented with embolic phenomenon to her right leg occluding her popliteal and tibial arteries.  She is doing well.  Her access site is well-healed.  Her foot is warm and that she does not have any significant pain.  There is been some mild discoloration to the toes and foot, but nothing severe.  She did not have a lot of postprocedural swelling.  Noninvasive studies today show a right ABI of 1.12 and a left ABI of 1.21 with triphasic waveforms and good digital waveforms.  Her right anterior tibial artery is occluded but this was known at her angiogram.  Posterior tibial artery is the dominant runoff.  Current Outpatient Medications  Medication Sig Dispense Refill  . amphetamine-dextroamphetamine (ADDERALL) 20 MG tablet Take one tablet in the morning and one tablet in the afternoon. (Patient taking differently: Take by mouth See admin instructions. Take 40 mg in the morning and 20 mg in the afternoon) 60 tablet 0  . apixaban (ELIQUIS) 5 MG TABS tablet Take 1 tablet (5 mg total) by mouth 2 (two) times daily. 60 tablet 2  . aspirin EC 81 MG EC tablet Take 1 tablet (81 mg total) by mouth daily. Swallow whole.    Marland Kitchen atorvastatin (LIPITOR) 10 MG tablet Take 1 tablet (10 mg total) by mouth daily. 30 tablet 2   No current facility-administered medications for this visit.    Past Medical History:  Diagnosis Date  . Anti-cyclic citrullinated peptide antibody positive   . 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 Dysphagia   . Easy bruising   . Family history of blood clots   . History of COVID-19    . Microalbuminuria   . Mitral valve prolapse   . Obesity   . Osteoarthritis of right knee   . Primary osteoarthritis of both knees   . Raynaud's disease without gangrene   . Rheumatoid factor positive   . Stricture and stenosis of esophagus     Past Surgical History:  Procedure Laterality Date  . CESAREAN SECTION    . ESOPHAGOGASTRODUODENOSCOPY N/A 02/21/2020   Procedure: ESOPHAGOGASTRODUODENOSCOPY (EGD);  Surgeon: Midge Minium, MD;  Location: [facility] ENDOSCOPY;  Service: Endoscopy;  Laterality: N/A;  . ESOPHAGOGASTRODUODENOSCOPY (EGD) WITH PROPOFOL N/A 04/20/2020   Procedure: ESOPHAGOGASTRODUODENOSCOPY (EGD) WITH PROPOFOL;  Surgeon: Midge Minium, MD;  Location[facility] ENDOSCOPY;  Service: Endoscopy;  Laterality: N/A;  . LOWER EXTREMITY ANGIOGRAPHY Right 11/18/2020   Procedure: Lower Extremity Angiography;  Surgeon: Annice Needy, MD;  Location: [facility] INVASIVE CV LAB;  Service: Cardiovascular;  Laterality: Right;  Marland Kitchen VASCULAR SURGERY       Social History   Tobacco Use  . Smoking status: Never Smoker  . Smokeless tobacco: Never Used  Substance Use Topics  . Alcohol use: No  . Drug use: No      Family History  Problem Relation Age of Onset  . Mental illness Mother   . Bipolar disorder Mother   . Breast cancer Mother   . Depression Mother   . Other Mother  DVTs  . Lupus Mother   . Hyperlipidemia Mother   . Rheum arthritis Mother   . Stroke Mother   . Coronary artery disease Father   . Osteoarthritis Daughter   . Osteoarthritis Son   . Other Sister        blood clots     Allergies  Allergen Reactions  . Codeine Nausea Only  . Epinephrine Palpitations     REVIEW OF SYSTEMS (Negative unless checked)  Constitutional: [] Weight loss  [] Fever  [] Chills Cardiac: [] Chest pain   [] Chest pressure   [] Palpitations   [] Shortness of breath when laying flat   [] Shortness of breath at rest   [] Shortness of breath with exertion. Vascular:  [] Pain in legs with walking    [] Pain in legs at rest   [] Pain in legs when laying flat   [] Claudication   [] Pain in feet when walking  [] Pain in feet at rest  [] Pain in feet when laying flat   [] History of DVT   [] Phlebitis   [] Swelling in legs   [] Varicose veins   [] Non-healing ulcers Pulmonary:   [] Uses home oxygen   [] Productive cough   [] Hemoptysis   [] Wheeze  [] COPD   [] Asthma Neurologic:  [] Dizziness  [] Blackouts   [] Seizures   [] History of stroke   [] History of TIA  [] Aphasia   [] Temporary blindness   [] Dysphagia   [] Weakness or numbness in arms   [] Weakness or numbness in legs Musculoskeletal:  [x] Arthritis   [] Joint swelling   [x] Joint pain   [] Low back pain Hematologic:  [x] Easy bruising  [] Easy bleeding   [] Hypercoagulable state   [] Anemic   Gastrointestinal:  [] Blood in stool   [] Vomiting blood  [] Gastroesophageal reflux/heartburn   [] Abdominal pain Genitourinary:  [] Chronic kidney disease   [] Difficult urination  [] Frequent urination  [] Burning with urination   [] Hematuria Skin:  [] Rashes   [] Ulcers   [] Wounds Psychological:  [] History of anxiety   []  History of major depression.  Physical Examination  BP (!) 162/90 (BP Location: Right Arm)   Pulse 69   Resp 16   Wt 236 lb 12.8 oz (107.4 kg)   BMI 41.95 kg/m  Gen:  WD/WN, NAD Head: [place]/AT, No temporalis wasting. Ear/Nose/Throat: Hearing grossly intact, nares w/o erythema or drainage Eyes: Conjunctiva clear. Sclera non-icteric Neck: Supple.  Trachea midline Pulmonary:  Good air movement, no use of accessory muscles.  Cardiac: RRR, no JVD Vascular:  Vessel Right Left  Radial Palpable Palpable                          PT Palpable Palpable  DP Not Palpable Palpable   Gastrointestinal: soft, non-tender/non-distended. No guarding/reflex.  Musculoskeletal: M/S 5/5 throughout.  No deformity or atrophy. No edema. Neurologic: Sensation grossly intact in extremities.  Symmetrical.  Speech is fluent.  Psychiatric: Judgment intact, Mood & affect appropriate  for pt's clinical situation. Dermatologic: No rashes or ulcers noted.  No cellulitis or open wounds.       Labs Recent Results (from the past 2160 hour(s))  CBC with Differential     Status: Abnormal   Collection Time: 11/18/20 10:16 AM  Result Value Ref Range   WBC 7.0 4.0 - 10.5 K/uL   RBC 5.46 (H) 3.87 - 5.11 MIL/uL   Hemoglobin 15.2 (H) 12.0 - 15.0 g/dL   HCT (H) - %   MCV 86.8 80.0 - 100.0 fL   MCH 27.8 26.0 - 34.0 pg  MCHC 32.1 30.0 - 36.0 g/dL   RDW 46.9 62.9 - 52.8 %   Platelets 249 150 - 400 K/uL   nRBC 0.0 0.0 - 0.2 %   Neutrophils Relative % 67 %   Neutro Abs 4.7 1.7 - 7.7 K/uL   Lymphocytes Relative 25 %   Lymphs Abs 1.8 0.7 - 4.0 K/uL   Monocytes Relative 6 %   Monocytes Absolute 0.4 0.1 - 1.0 K/uL   Eosinophils Relative 2 %   Eosinophils Absolute 0.1 0.0 - 0.5 K/uL   Basophils Relative 0 %   Basophils Absolute 0.0 0.0 - 0.1 K/uL   Immature Granulocytes 0 %   Abs Immature Granulocytes 0.01 0.00 - 0.07 K/uL    Comment: Performed at [facility], [address], [place], [place] [address]  Basic metabolic panel     Status: Abnormal   Collection Time: 11/18/20 10:16 AM  Result Value Ref Range   Sodium 139 135 - 145 mmol/L   Potassium 3.8 3.5 - 5.1 mmol/L   Chloride 104 98 - 111 mmol/L   CO2 24 22 - 32 mmol/L   Glucose, Bld 114 (H) 70 - 99 mg/dL    Comment: Glucose reference range applies only to samples taken after fasting for at least 8 hours.   BUN 18 8 - 23 mg/dL   Creatinine, Ser 4.01 (H) 0.44 - 1.00 mg/dL   Calcium 8.8 (L) 8.9 - 10.3 mg/dL   GFR, Estimated >02 >72 mL/min    Comment: (NOTE) Calculated using the CKD-EPI Creatinine Equation (2021)    Anion gap 11 5 - 15    Comment: Performed at [facility], [address] [place], [place] [address]  Protime-INR     Status: None   Collection Time: 11/18/20 10:16 AM  Result Value Ref Range   Prothrombin Time 12.6 11.4 - 15.2 seconds   INR 1.0 0.8 - 1.2     Comment: (NOTE) INR goal varies based on device and disease states. Performed at [facility], [address] [place], [place] [address]   APTT     Status: None   Collection Time: 11/18/20 10:16 AM  Result Value Ref Range   aPTT 25 24 - 36 seconds    Comment: Performed at [facility], [address] [place], [place] [address]  Hepatic function panel     Status: None   Collection Time: 11/18/20 10:16 AM  Result Value Ref Range   Total Protein 7.3 6.5 - 8.1 g/dL   Albumin 3.8 3.5 - 5.0 g/dL   AST 23 15 - 41 U/L   ALT 19 0 - 44 U/L   Alkaline Phosphatase 99 38 - 126 U/L   Total Bilirubin 0.5 0.3 - 1.2 mg/dL   Bilirubin, Direct 0.1 0.0 - 0.2 mg/dL   Indirect Bilirubin 0.4 0.3 - 0.9 mg/dL    Comment: Performed at [facility], [address], [place], [place] [address]  Resp Panel by RT-PCR (Flu A&B, Covid) Nasopharyngeal Swab     Status: None   Collection Time: 11/18/20 10:17 AM   Specimen: Nasopharyngeal Swab; Nasopharyngeal(NP) swabs in vial transport medium  Result Value Ref Range   SARS Coronavirus 2 by RT PCR NEGATIVE NEGATIVE    Comment: (NOTE) SARS-CoV-2 target nucleic acids are NOT DETECTED.  The SARS-CoV-2 RNA is generally detectable in upper respiratory specimens during the acute phase of infection. The lowest concentration of SARS-CoV-2 viral copies this assay can detect is 138 copies/mL. A  negative result does not preclude SARS-Cov-2 infection and should not be used as the sole basis for treatment or other patient management decisions. A negative result may occur with  improper specimen collection/handling, submission of specimen other than nasopharyngeal swab, presence of viral mutation(s) within the areas targeted by this assay, and inadequate number of viral copies(<138 copies/mL). A negative result must be combined with clinical observations, patient history, and epidemiological information. The expected result is Negative.  Fact  Sheet for Patients:  BloggerCourse.comhttps://www.fda.gov/media/152166/download  Fact Sheet for Healthcare Providers:  SeriousBroker.ithttps://www.fda.gov/media/152162/download  This test is no t yet approved or cleared by the [place] FDA and  has been authorized for detection and/or diagnosis of SARS-CoV-2 by FDA under an Emergency Use Authorization (EUA). This EUA will remain  in effect (meaning this test can be used) for the duration of the COVID-19 declaration under Section 564(b)(1) of the Act, 21 U.S.C.section 360bbb-3(b)(1), unless the authorization is terminated  or revoked sooner.       Influenza A by PCR NEGATIVE NEGATIVE   Influenza B by PCR NEGATIVE NEGATIVE    Comment: (NOTE) The Xpert Xpress SARS-CoV-2/FLU/RSV plus assay is intended as an aid in the diagnosis of influenza from Nasopharyngeal swab specimens and should not be used as a sole basis for treatment. Nasal washings and aspirates are unacceptable for Xpert Xpress SARS-CoV-2/FLU/RSV testing.  Fact Sheet for Patients: BloggerCourse.comhttps://www.fda.gov/media/152166/download  Fact Sheet for Healthcare Providers: SeriousBroker.ithttps://www.fda.gov/media/152162/download  This test is not yet approved or cleared by the [place] FDA and has been authorized for detection and/or diagnosis of SARS-CoV-2 by FDA under an Emergency Use Authorization (EUA). This EUA will remain in effect (meaning this test can be used) for the duration of the COVID-19 declaration under Section 564(b)(1) of the Act, 21 U.S.C. section 360bbb-3(b)(1), unless the authorization is terminated or revoked.  Performed at [facility], [address] [place], [place] [address]   HIV Antibody (routine testing w rflx)     Status: None   Collection Time: 11/19/20  3:49 AM  Result Value Ref Range   HIV Screen 4th Generation wRfx Non Reactive Non Reactive    Comment: Performed at [facility], [address] [place], [place] [address]  Basic metabolic panel     Status:  Abnormal   Collection Time: 11/19/20  3:49 AM  Result Value Ref Range   Sodium 138 135 - 145 mmol/L   Potassium 3.7 3.5 - 5.1 mmol/L   Chloride 110 98 - 111 mmol/L   CO2 22 22 - 32 mmol/L   Glucose, Bld 98 70 - 99 mg/dL    Comment: Glucose reference range applies only to samples taken after fasting for at least 8 hours.   BUN 14 8 - 23 mg/dL   Creatinine, Ser 0.980.85 0.44 - 1.00 mg/dL   Calcium 8.2 (L) 8.9 - 10.3 mg/dL   GFR, Estimated >11>60 >91>60 mL/min    Comment: (NOTE) Calculated using the CKD-EPI Creatinine Equation (2021)    Anion gap 6 5 - 15    Comment: Performed at [facility], [address] [place], [place] [address]  CBC     Status: None   Collection Time: 11/19/20  3:49 AM  Result Value Ref Range   WBC 8.0 4.0 - 10.5 K/uL   RBC 4.67 3.87 - 5.11 MIL/uL   Hemoglobin 13.2 12.0 - 15.0 g/dL   HCT 56.240.6 13.036.0 - 86.546.0 %   MCV 86.9 80.0 - 100.0 fL   MCH 28.3  26.0 - 34.0 pg   MCHC 32.5 30.0 - 36.0 g/dL   RDW 16.1 09.6 - 04.5 %   Platelets 240 150 - 400 K/uL   nRBC 0.0 0.0 - 0.2 %    Comment: Performed at [facility], [address] [place], [place] [address]  Protime-INR     Status: None   Collection Time: 11/19/20  3:49 AM  Result Value Ref Range   Prothrombin Time 13.3 11.4 - 15.2 seconds   INR 1.1 0.8 - 1.2    Comment: (NOTE) INR goal varies based on device and disease states. Performed at [facility], [address] [place], [place] [address]   APTT     Status: None   Collection Time: 11/19/20  3:49 AM  Result Value Ref Range   aPTT 27 24 - 36 seconds    Comment: Performed at [facility], [address] [place], [place] [address]  Antithrombin III     Status: Abnormal   Collection Time: 12/15/20 12:14 PM  Result Value Ref Range   AntiThromb III Func 129 (H) 75 - 120 %    Comment: Performed at [facility], [address], [place], [place] [address]  Protein S, total     Status: None   Collection Time: 12/15/20  12:14 PM  Result Value Ref Range   Protein S Ag, Total 125 60 - 150 %    Comment: (NOTE) This test was developed and its performance characteristics determined by [facility]. It has not been cleared or approved by the Food and Drug Administration. Performed At: [facility] [address] [place], [place] [id number] Jolene Schimke MD [id number]   Protein C, total     Status: Abnormal   Collection Time: 12/15/20 12:14 PM  Result Value Ref Range   Protein C, Total 164 (H) 60 - 150 %    Comment: (NOTE) Performed At: [facility] [address] [place], [place] [id number] Jolene Schimke MD [id number]   Beta-2-glycoprotein i abs, IgG/M/A     Status: None   Collection Time: 12/15/20 12:14 PM  Result Value Ref Range   Beta-2 Glyco I IgG <9 0 - 20 GPI IgG units    Comment: (NOTE) The reference interval reflects a 3SD or 99th percentile interval, which is thought to represent a potentially clinically significant result in accordance with the International Consensus Statement on the classification criteria for definitive antiphospholipid syndrome (APS). J Thromb Haem 2006;4:295-306.    Beta-2-Glycoprotein I IgM 15 0 - 32 GPI IgM units    Comment: (NOTE) The reference interval reflects a 3SD or 99th percentile interval, which is thought to represent a potentially clinically significant result in accordance with the International Consensus Statement on the classification criteria for definitive antiphospholipid syndrome (APS). J Thromb Haem 2006;4:295-306. Performed At: [facility] [address] [place], [place] [id number] Jolene Schimke MD [id number]    Beta-2-Glycoprotein I IgA <9 0 - 25 GPI IgA units    Comment: (NOTE) The reference interval reflects a 3SD or 99th percentile interval, which is thought to represent a potentially clinically significant result in accordance with the International Consensus Statement on the classification criteria for  definitive antiphospholipid syndrome (APS). J Thromb Haem 2006;4:295-306.   Comprehensive metabolic panel     Status: Abnormal   Collection Time: 12/15/20 12:14 PM  Result Value Ref Range   Sodium 139 135 - 145 mmol/L   Potassium 3.8 3.5 - 5.1 mmol/L   Chloride 101 98 -  111 mmol/L   CO2 27 22 - 32 mmol/L   Glucose, Bld 93 70 - 99 mg/dL    Comment: Glucose reference range applies only to samples taken after fasting for at least 8 hours.   BUN 14 8 - 23 mg/dL   Creatinine, Ser 0.45 0.44 - 1.00 mg/dL   Calcium 8.8 (L) 8.9 - 10.3 mg/dL   Total Protein 7.6 6.5 - 8.1 g/dL   Albumin 4.2 3.5 - 5.0 g/dL   AST 22 15 - 41 U/L   ALT 25 0 - 44 U/L   Alkaline Phosphatase 105 38 - 126 U/L   Total Bilirubin 0.4 0.3 - 1.2 mg/dL   GFR, Estimated >40 >98 mL/min    Comment: (NOTE) Calculated using the CKD-EPI Creatinine Equation (2021)    Anion gap 11 5 - 15    Comment: Performed at [facility], [address] [place], [place] [address]  CBC with Differential/Platelet     Status: None   Collection Time: 12/15/20 12:14 PM  Result Value Ref Range   WBC 8.8 4.0 - 10.5 K/uL   RBC 5.06 3.87 - 5.11 MIL/uL   Hemoglobin 14.3 12.0 - 15.0 g/dL   HCT 78.2 95.6 - 21.3 %   MCV 88.7 80.0 - 100.0 fL   MCH 28.3 26.0 - 34.0 pg   MCHC 31.8 30.0 - 36.0 g/dL   RDW 08.6 57.8 - 46.9 %   Platelets 288 150 - 400 K/uL   nRBC 0.0 0.0 - 0.2 %   Neutrophils Relative % 63 %   Neutro Abs 5.6 1.7 - 7.7 K/uL   Lymphocytes Relative 28 %   Lymphs Abs 2.5 0.7 - 4.0 K/uL   Monocytes Relative 5 %   Monocytes Absolute 0.4 0.1 - 1.0 K/uL   Eosinophils Relative 3 %   Eosinophils Absolute 0.2 0.0 - 0.5 K/uL   Basophils Relative 1 %   Basophils Absolute 0.1 0.0 - 0.1 K/uL   Immature Granulocytes 0 %   Abs Immature Granulocytes 0.03 0.00 - 0.07 K/uL    Comment: Performed at [facility], [address] [place], [place] [address]    Radiology PERIPHERAL VASCULAR CATHETERIZATION  Result Date:  11/18/2020 See op note   Assessment/Plan  Lower extremity arterial insufficiency, severe, right (HCC) Noninvasive studies today show a right ABI of 1.12 and a left ABI of 1.21 with triphasic waveforms and good digital waveforms.  Her right anterior tibial artery is occluded but this was known at her angiogram.  Posterior tibial artery is the dominant runoff.  She is doing well after extensive revascularization for limb threatening acute ischemia.  Continue full anticoagulation.  Has seen hematology and will defer long-term management of her anticoagulation to them.  I will plan to see her back in 3 months with noninvasive studies.  Raynaud's disease without gangrene Symptoms stable and long standing.  Varicose veins of right lower extremity with pain Has some venous stasis changes and prominent varicosities on the right leg.  Elevated and compression stockings as needed.    Festus Barren, MD  12/17/2020 9:09 AM    This note was created with Dragon medical transcription system.  Any errors from dictation are purely unintentional

## 2020-12-17 NOTE — Assessment & Plan Note (Signed)
Has some venous stasis changes and prominent varicosities on the right leg.  Elevated and compression stockings as needed.

## 2020-12-17 NOTE — Assessment & Plan Note (Signed)
Symptoms stable and long standing.

## 2020-12-17 NOTE — Assessment & Plan Note (Signed)
Noninvasive studies today show a right ABI of 1.12 and a left ABI of 1.21 with triphasic waveforms and good digital waveforms.  Her right anterior tibial artery is occluded but this was known at her angiogram.  Posterior tibial artery is the dominant runoff.  She is doing well after extensive revascularization for limb threatening acute ischemia.  Continue full anticoagulation.  Has seen hematology and will defer long-term management of her anticoagulation to them.  I will plan to see her back in 3 months with noninvasive studies.

## 2020-12-17 NOTE — Telephone Encounter (Addendum)
Patient left a voicemail stating that she caught a bad cramp while walking in the store and she is just concern due to previous dvt. Patient was made aware that everything should be fine

## 2020-12-21 LAB — FACTOR 5 LEIDEN

## 2020-12-23 ENCOUNTER — Telehealth: Payer: Self-pay | Admitting: Internal Medicine

## 2020-12-23 LAB — PROTHROMBIN GENE MUTATION

## 2020-12-23 NOTE — Telephone Encounter (Signed)
On 50/05-spoke to patient regarding results of the hypercoagulable work-up negative.  Continue Eliquis as for now.   Follow-up as planned.   GB

## 2021-01-05 ENCOUNTER — Telehealth (INDEPENDENT_AMBULATORY_CARE_PROVIDER_SITE_OTHER): Payer: Self-pay

## 2021-01-05 NOTE — Telephone Encounter (Signed)
It's possible but anytime a woman has post menopausal vaginal bleeding she should be evaluated by her OBGYN or PCP.  Since it only happens when she pees, it may be coming from her bladder Given that we just intervened, less than 3 months ago, stopping or reducing her eliquis at this time is not in her best interest. I would suggest she calls her PCP for further evaluation

## 2021-01-05 NOTE — Telephone Encounter (Signed)
The pt called and left a VM on the nurses line wanting to know since she has had a previous DVT and is on elliquis could that be the reason she is spotting and if so should it be reduced she no longer has a menstrual cycle  She only noticed the blood when she Urinates none one a panty liner this has been going on since Monday this week please advise.

## 2021-01-05 NOTE — Telephone Encounter (Signed)
I called the pt and made her aware of the NP instructions.  °

## 2021-01-06 ENCOUNTER — Other Ambulatory Visit: Payer: Self-pay | Admitting: Family Medicine

## 2021-01-06 DIAGNOSIS — R3121 Asymptomatic microscopic hematuria: Secondary | ICD-10-CM

## 2021-01-07 ENCOUNTER — Other Ambulatory Visit: Payer: Self-pay

## 2021-01-07 ENCOUNTER — Ambulatory Visit
Admission: RE | Admit: 2021-01-07 | Discharge: 2021-01-07 | Disposition: A | Discharge status: Home / Self Care | Payer: BC Managed Care – PPO | Source: Ambulatory Visit | Attending: Family Medicine | Admitting: Family Medicine

## 2021-01-07 DIAGNOSIS — R3121 Asymptomatic microscopic hematuria: Secondary | ICD-10-CM | POA: Insufficient documentation

## 2021-01-10 ENCOUNTER — Ambulatory Visit: Payer: BC Managed Care – PPO | Admitting: Cardiovascular Disease

## 2021-01-14 ENCOUNTER — Ambulatory Visit (INDEPENDENT_AMBULATORY_CARE_PROVIDER_SITE_OTHER): Payer: BC Managed Care – PPO

## 2021-01-14 ENCOUNTER — Other Ambulatory Visit: Payer: Self-pay

## 2021-01-14 ENCOUNTER — Ambulatory Visit: Payer: BC Managed Care – PPO | Admitting: Cardiovascular Disease

## 2021-01-14 ENCOUNTER — Encounter: Payer: Self-pay | Admitting: Cardiovascular Disease

## 2021-01-14 VITALS — BP 150/88 | HR 96 | Ht 62.0 in | Wt 240.1 lb

## 2021-01-14 DIAGNOSIS — R002 Palpitations: Secondary | ICD-10-CM

## 2021-01-14 DIAGNOSIS — R0602 Shortness of breath: Secondary | ICD-10-CM | POA: Diagnosis not present

## 2021-01-14 DIAGNOSIS — I749 Embolism and thrombosis of unspecified artery: Secondary | ICD-10-CM | POA: Diagnosis not present

## 2021-01-14 DIAGNOSIS — E785 Hyperlipidemia, unspecified: Secondary | ICD-10-CM | POA: Diagnosis not present

## 2021-01-14 NOTE — Progress Notes (Signed)
Cardiology Office Note   Date:  01/14/2021   ID:  Jade Edwards, DOB 1955-12-08, MRN [id number]  PCP:  Rayetta Humphrey, MD  Cardiologist:   Lorine Bears, MD   Chief Complaint  Patient presents with  . New Patient (Initial Visit)    Ref by Dr. Greggory Stallion for evaluation of arterial embolism and thrombosis of lower extremity. Medications reviewed by the patient verbally. Patient c/o fatigue and shortness of breath with over exertion and LE edema.       History of Present Illness: Jade Edwards is a 65 y.o. female who was referred by Dr. Greggory Stallion for evaluation of source of embolism. She has past medical history of Raynaud's disease, GERD and obesity.  She was hospitalized in April with acute right foot pain secondary to acute limb ischemia.  An angiogram was performed which showed abrupt occlusion of the right popliteal artery as well as occlusion of all 3 tibial vessels in the proximal segment.  She underwent thrombectomy and balloon angioplasty.  She is currently on anticoagulation with Eliquis. She has no prior history of DVT/PE or arterial thrombosis.  There is family history of DVTs and hypercoagulable state.  She underwent hypercoagulable work-up by Dr. Donneta Romberg which was negative.  The patient reports intermittent palpitations but no known history of atrial fibrillation.  No history of congenital heart disease or PFO.  She does not have history of aortic disease as far as we know.  She has chronic exertional dyspnea but no chest pain.  She did have COVID-19 infection in January 2 months preceding her presentation with arterial thrombosis/embolism.  She is currently having issues with hematuria. She is a lifelong non-smoker.   Past Medical History:  Diagnosis Date  . Anti-cyclic citrullinated peptide antibody positive   . 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 Dysphagia   . Easy  bruising   . Family history of blood clots   . History of COVID-19   . Microalbuminuria   . Mitral valve prolapse   . Obesity   . Osteoarthritis of right knee   . Primary osteoarthritis of both knees   . Raynaud's disease without gangrene   . Rheumatoid factor positive   . Stricture and stenosis of esophagus     Past Surgical History:  Procedure Laterality Date  . CESAREAN SECTION    . ESOPHAGOGASTRODUODENOSCOPY N/A 02/21/2020   Procedure: ESOPHAGOGASTRODUODENOSCOPY (EGD);  Surgeon: Midge Minium, MD;  Location[facility] ENDOSCOPY;  Service: Endoscopy;  Laterality: N/A;  . ESOPHAGOGASTRODUODENOSCOPY (EGD) WITH PROPOFOL N/A 04/20/2020   Procedure: ESOPHAGOGASTRODUODENOSCOPY (EGD) WITH PROPOFOL;  Surgeon: Midge Minium, MD;  Location: [facility] ENDOSCOPY;  Service: Endoscopy;  Laterality: N/A;  . LOWER EXTREMITY ANGIOGRAPHY Right 11/18/2020   Procedure: Lower Extremity Angiography;  Surgeon: Annice Needy, MD;  Location: [facility] INVASIVE CV LAB;  Service: Cardiovascular;  Laterality: Right;  Marland Kitchen VASCULAR SURGERY       Current Outpatient Medications  Medication Sig Dispense Refill  . amphetamine-dextroamphetamine (ADDERALL) 20 MG tablet Take one tablet in the morning and one tablet in the afternoon. 60 tablet 0  . apixaban (ELIQUIS) 5 MG TABS tablet Take 1 tablet (5 mg total) by mouth 2 (two) times daily. 60 tablet 2  . aspirin EC 81 MG EC tablet Take 1 tablet (81 mg total) by mouth daily. Swallow whole.    Marland Kitchen atorvastatin (LIPITOR) 10 MG tablet Take 1 tablet (10 mg total)  by mouth daily. 30 tablet 2   No current facility-administered medications for this visit.    Allergies:   Codeine and Epinephrine    Social History:  The patient  reports that she has never smoked. She has never used smokeless tobacco. She reports that she does not drink alcohol and does not use drugs.   Family History:  The patient's family history includes Bipolar disorder in her mother; Breast cancer in her mother; Coronary  artery disease in her father; Depression in her mother; Heart attack (age of onset: 22) in her father; Hyperlipidemia in her mother; Lupus in her mother; Mental illness in her mother; Osteoarthritis in her daughter and son; Other in her mother and sister; Rheum arthritis in her mother; Stroke in her mother.    ROS:  Please see the history of present illness.   Otherwise, review of systems are positive for none.   All other systems are reviewed and negative.    PHYSICAL EXAM: VS:  BP (!) 150/88 (BP Location: Right Arm, Patient Position: Sitting, Cuff Size: Large)   Pulse 96   Ht 5\' 2"  (1.575 m)   Wt 240 lb 2 oz (108.9 kg)   SpO2 98%   BMI 43.92 kg/m  , BMI Body mass index is 43.92 kg/m. GEN: Well nourished, well developed, in no acute distress  HEENT: normal  Neck: no JVD, carotid bruits, or masses Cardiac: RRR; no murmurs, rubs, or gallops,no edema  Respiratory:  clear to auscultation bilaterally, normal work of breathing GI: soft, nontender, nondistended, + BS MS: no deformity or atrophy  Skin: warm and dry, no rash Neuro:  Strength and sensation are intact Psych: euthymic mood, full affect   EKG:  EKG is ordered today. The ekg ordered today demonstrates normal sinus rhythm with no significant ST or T wave changes.   Recent Labs: 12/15/2020: ALT 25; BUN 14; Creatinine, Ser 0.91; Hemoglobin 14.3; Platelets 288; Potassium 3.8; Sodium 139    Lipid Panel    Component Value Date/Time   CHOL 209 (H) 11/18/2012 1016   TRIG 172.0 (H) 11/18/2012 1016   HDL 48.30 11/18/2012 1016   CHOLHDL 4 11/18/2012 1016   VLDL 34.4 11/18/2012 1016   LDLCALC 115 (H) 11/22/2011 0822   LDLDIRECT 139.9 11/18/2012 1016      Wt Readings from Last 3 Encounters:  01/14/21 240 lb 2 oz (108.9 kg)  12/17/20 236 lb 12.8 oz (107.4 kg)  12/15/20 234 lb 11.2 oz (106.5 kg)        PAD Screen 01/14/2021  Previous PAD dx? No  Previous surgical procedure? No  Pain with walking? No  Feet/toe relief  with dangling? No  Painful, non-healing ulcers? No  Extremities discolored? No      ASSESSMENT AND PLAN:  1.  Status post acute limb ischemia due to abrupt occlusion of the right popliteal artery in March.  This was likely embolic given the location and less likely to be due to in situ thrombosis. I agree that we have to evaluate for cardiac source of embolism.  She does report mild palpitations but no documented history of atrial fibrillation.  I requested a 2-week outpatient monitor. In addition, we will have to exclude the possibility of intracardiac thrombus or PFO.  I requested an echocardiogram. The other possibility which is less likely is an ulcerated plaque in the aorta with thrombus formation.  For that, I requested CTA of the chest, abdomen and pelvis. We will then have to determine the length of  anticoagulation.  2.  Hyperlipidemia: Currently on atorvastatin.    Disposition:   FU with me in 2 months  Signed,  Lorine Bears, MD  01/14/2021 10:35 AM    [facility]

## 2021-01-14 NOTE — Patient Instructions (Addendum)
Medication Instructions:  Your physician recommends that you continue on your current medications as directed. Please refer to the Current Medication list given to you today.  *If you need a refill on your cardiac medications before your next appointment, please call your pharmacy*   Lab Work: Bmp today If you have labs (blood work) drawn today and your tests are completely normal, you will receive your results only by: Marland Kitchen MyChart Message (if you have MyChart) OR . A paper copy in the mail If you have any lab test that is abnormal or we need to change your treatment, we will call you to review the results.   Testing/Procedures: Your physician has requested that you have an echocardiogram. Echocardiography is a painless test that uses sound waves to create images of your heart. It provides your doctor with information about the size and shape of your heart and how well your heart's chambers and valves are working. This procedure takes approximately one hour. There are no restrictions for this procedure.   Non-Cardiac CT Angiography (CTA), is a special type of CT scan that uses a computer to produce multi-dimensional views of major blood vessels throughout the body. In CT angiography, a contrast material is injected through an IV to help visualize the blood vessels (Please call [phone] to schedule)    Your physician has recommended that you wear a Zio monitor XT (To be worn for 14 days).  This monitor is a medical device that records the heart's electrical activity. Doctors most often use these monitors to diagnose arrhythmias. Arrhythmias are problems with the speed or rhythm of the heartbeat. The monitor is a small device applied to your chest. You can wear one while you do your normal daily activities. While wearing this monitor if you have any symptoms to push the button and record what you felt. Once you have worn this monitor for the period of time provider prescribed (Usually 14  days), you will return the monitor device in the postage paid box. Once it is returned they will download the data collected and provide [place] with a report which the provider will then review and we will call you with those results. Important tips:  1. Avoid showering during the first 24 hours of wearing the monitor. 2. Avoid excessive sweating to help maximize wear time. 3. Do not submerge the device, no hot tubs, and no swimming pools. 4. Keep any lotions or oils away from the patch. 5. After 24 hours you may shower with the patch on. Take brief showers with your back facing the shower head.  6. Do not remove patch once it has been placed because that will interrupt data and decrease adhesive wear time. 7. Push the button when you have any symptoms and write down what you were feeling. 8. Once you have completed wearing your monitor, remove and place into box which has postage paid and place in your outgoing mailbox.  9. If for some reason you have misplaced your box then call our office and we can provide another box and/or mail it off for you.         Follow-Up: At [facility], you and your health needs are our priority.  As part of our continuing mission to provide you with exceptional heart care, we have created designated Provider Care Teams.  These Care Teams include your primary Cardiologist (physician) and Advanced Practice Providers (APPs -  Physician Assistants and Nurse Practitioners) who all work together to provide you with the  care you need, when you need it.  We recommend signing up for the patient portal called "MyChart".  Sign up information is provided on this After Visit Summary.  MyChart is used to connect with patients for Virtual Visits (Telemedicine).  Patients are able to view lab/test results, encounter notes, upcoming appointments, etc.  Non-urgent messages can be sent to your provider as well.   To learn more about what you can do with MyChart, go to  [email or web address].    Your next appointment:   4 week(s)  The format for your next appointment:   In Person  Provider:   You may see Lorine Bears, MD or one of the following Advanced Practice Providers on your designated Care Team:    Nicolasa Ducking, NP  Eula Listen, PA-C  Marisue Ivan, PA-C  Cadence Darden, [place]  Gillian Shields, NP    Other Instructions N/A

## 2021-01-20 LAB — BASIC METABOLIC PANEL
BUN/Creatinine Ratio: 14 (ref 12–28)
BUN: 13 mg/dL (ref 8–27)
Creatinine, Ser: 0.96 mg/dL (ref 0.57–1.00)
Glucose: 80 mg/dL (ref 65–99)
eGFR: 66 mL/min/{1.73_m2} (ref >59)

## 2021-01-25 ENCOUNTER — Telehealth: Payer: Self-pay

## 2021-01-25 DIAGNOSIS — R002 Palpitations: Secondary | ICD-10-CM

## 2021-01-25 DIAGNOSIS — R0602 Shortness of breath: Secondary | ICD-10-CM

## 2021-01-25 NOTE — Telephone Encounter (Signed)
Left a detailed message on the patient's voice mail to contact our office back regarding her lab work (BMP).

## 2021-01-25 NOTE — Telephone Encounter (Addendum)
Patient notified of contamination of her blood work from 01/14/2021 due to [employer] in their tube process. The patient is aware she will report to [facility] medical mall entrance to have her BMET repeated.

## 2021-01-26 ENCOUNTER — Telehealth (INDEPENDENT_AMBULATORY_CARE_PROVIDER_SITE_OTHER): Payer: Self-pay | Admitting: Vascular Surgery

## 2021-01-26 NOTE — Telephone Encounter (Signed)
Sue Lush finishing note

## 2021-01-26 NOTE — Telephone Encounter (Signed)
Patient was advise per last note that defer long term management of anticoagulation should go through her hematologist and she should contact her PCP for atorvastatin refills.

## 2021-01-26 NOTE — Telephone Encounter (Signed)
Patient called in stating her Eliquis was rejected, and would like for Dr. Wyn Quaker to push the request through for a refill.  Patient also inquired about possibly doing 3 months supply.   Patient called pharmacy just to check to see why it was rejected?  Patient stated the hospital prescribed to her first during her visit and now needs Dr. Wyn Quaker to prescribe her 5mg  of Eliquis and 10mg  of Atorzastatin.  Patient pharmacy of choice is [facility] in [place].

## 2021-01-27 ENCOUNTER — Encounter: Payer: Self-pay | Admitting: Internal Medicine

## 2021-01-28 ENCOUNTER — Other Ambulatory Visit
Admission: RE | Admit: 2021-01-28 | Discharge: 2021-01-28 | Disposition: A | Discharge status: Home / Self Care | Payer: BC Managed Care – PPO | Source: Ambulatory Visit | Attending: Cardiovascular Disease | Admitting: Cardiovascular Disease

## 2021-01-28 DIAGNOSIS — R0602 Shortness of breath: Secondary | ICD-10-CM | POA: Diagnosis present

## 2021-01-28 DIAGNOSIS — R002 Palpitations: Secondary | ICD-10-CM | POA: Diagnosis not present

## 2021-01-28 LAB — BASIC METABOLIC PANEL
Anion gap: 7 (ref 5–15)
BUN: 12 mg/dL (ref 8–23)
CO2: 26 mmol/L (ref 22–32)
Calcium: 8.7 mg/dL — ABNORMAL LOW (ref 8.9–10.3)
Chloride: 106 mmol/L (ref 98–111)
Creatinine, Ser: 0.85 mg/dL (ref 0.44–1.00)
GFR, Estimated: >60 mL/min (ref >60)
Glucose, Bld: 107 mg/dL — ABNORMAL HIGH (ref 70–99)
Potassium: 4.1 mmol/L (ref 3.5–5.1)
Sodium: 139 mmol/L (ref 135–145)

## 2021-02-03 ENCOUNTER — Ambulatory Visit: Payer: Self-pay | Admitting: Urology

## 2021-02-05 ENCOUNTER — Ambulatory Visit
Admission: RE | Admit: 2021-02-05 | Discharge: 2021-02-05 | Disposition: A | Discharge status: Home / Self Care | Payer: BC Managed Care – PPO | Source: Ambulatory Visit | Attending: Student | Admitting: Student

## 2021-02-05 ENCOUNTER — Other Ambulatory Visit: Payer: Self-pay | Admitting: Student

## 2021-02-05 ENCOUNTER — Other Ambulatory Visit: Payer: Self-pay

## 2021-02-05 ENCOUNTER — Other Ambulatory Visit: Payer: Self-pay | Admitting: Family Medicine

## 2021-02-05 DIAGNOSIS — M7989 Other specified soft tissue disorders: Secondary | ICD-10-CM | POA: Insufficient documentation

## 2021-02-08 ENCOUNTER — Ambulatory Visit: Payer: Self-pay | Admitting: Urology

## 2021-02-15 ENCOUNTER — Telehealth: Payer: Self-pay

## 2021-02-15 NOTE — Telephone Encounter (Signed)
Called to give the patient monitor results and Dr. Jari Sportsman recommendation. Unable to lmom pt phone rings out. Will attempt again.

## 2021-02-15 NOTE — Telephone Encounter (Signed)
-----   Message from Muhammad A Arida, MD sent at 02/14/2021  4:57 PM EDT ----- Inform patient that monitor showed frequent runs of supraventricular tachycardia with some episodes highly suggestive of short runs of atrial fibrillation.  Based on this, and given previous embolism, she will require continuation of anticoagulation with Eliquis indefinitely.  

## 2021-02-17 DIAGNOSIS — I700 Atherosclerosis of aorta: Secondary | ICD-10-CM | POA: Insufficient documentation

## 2021-02-17 DIAGNOSIS — K760 Fatty (change of) liver, not elsewhere classified: Secondary | ICD-10-CM | POA: Insufficient documentation

## 2021-02-17 NOTE — Telephone Encounter (Signed)
-----   Message from [place] Ouch, MD sent at 02/14/2021  4:57 PM EDT ----- Inform patient that monitor showed frequent runs of supraventricular tachycardia with some episodes highly suggestive of short runs of atrial fibrillation.  Based on this, and given previous embolism, she will require continuation of anticoagulation with Eliquis indefinitely.

## 2021-02-17 NOTE — Telephone Encounter (Signed)
2nd attempt to contact the patient. [email or web address]. 

## 2021-02-22 ENCOUNTER — Ambulatory Visit: Payer: BC Managed Care – PPO | Admitting: Cardiovascular Disease

## 2021-02-22 ENCOUNTER — Ambulatory Visit (INDEPENDENT_AMBULATORY_CARE_PROVIDER_SITE_OTHER): Payer: BC Managed Care – PPO

## 2021-02-22 ENCOUNTER — Other Ambulatory Visit: Payer: Self-pay

## 2021-02-22 DIAGNOSIS — R0602 Shortness of breath: Secondary | ICD-10-CM

## 2021-02-22 LAB — ECHOCARDIOGRAM COMPLETE
AR max vel: 3.77 cm2
AV Area VTI: 3.47 cm2
AV Area mean vel: 3.48 cm2
AV Mean grad: 2 mmHg
AV Peak grad: 3.1 mmHg
Ao pk vel: 0.88 m/s
Area-P 1/2: 3.6 cm2
Calc EF: 52.9 %
S' Lateral: 2.5 cm
Single Plane A2C EF: 51.2 %
Single Plane A4C EF: 52.6 %

## 2021-02-23 NOTE — Telephone Encounter (Signed)
Late entry:  Patient has been made aware of Dr. Jari Sportsman recommendation on 02/17/21 with verbalized understanding. Patient agreeable with the plan and voiced appreciation for the call.

## 2021-02-24 ENCOUNTER — Other Ambulatory Visit: Payer: Self-pay

## 2021-02-24 ENCOUNTER — Ambulatory Visit: Payer: BC Managed Care – PPO | Admitting: Nurse Practitioner

## 2021-02-24 ENCOUNTER — Encounter: Payer: Self-pay | Admitting: Nurse Practitioner

## 2021-02-24 VITALS — BP 150/90 | HR 89 | Ht 62.0 in | Wt 239.0 lb

## 2021-02-24 DIAGNOSIS — I471 Supraventricular tachycardia: Secondary | ICD-10-CM | POA: Diagnosis not present

## 2021-02-24 DIAGNOSIS — I480 Paroxysmal atrial fibrillation: Secondary | ICD-10-CM

## 2021-02-24 DIAGNOSIS — I739 Peripheral vascular disease, unspecified: Secondary | ICD-10-CM

## 2021-02-24 DIAGNOSIS — Z6841 Body Mass Index (BMI) 40.0 and over, adult: Secondary | ICD-10-CM

## 2021-02-24 DIAGNOSIS — G4730 Sleep apnea, unspecified: Secondary | ICD-10-CM

## 2021-02-24 MED ORDER — METOPROLOL TARTRATE 25 MG PO TABS: 25.0000 mg | ORAL_TABLET | Freq: Two times a day (BID) | ORAL | 3 refills | Status: DC

## 2021-02-24 NOTE — Progress Notes (Addendum)
Office Visit    Patient Name: Jade Edwards Date of Encounter: 02/24/2021  Primary Care Provider:  Rayetta Humphrey, MD Primary Cardiologist:  Lorine Bears, MD  Chief Complaint    65 y/o ? w/ a h/o lower ext arterial embolism, Raynaud's disease, GERD, ADD, and obesity, who presents for follow-up after recent monitoring showed PSVT with question of atrial fibrillation.  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 11/2020 Angio: 100% R popliteal artery and all 3 tibial vessels in the proximal segment-->s/p thrombectomy & PTA.   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 02/2021 Echo: EF 60-65%, no rwma. Nl RV fxn. Triv MR.   Obesity    Osteoarthritis of right knee    PAF (paroxysmal atrial fibrillation) (HCC)    a. 01/2021 Zio: 53 runs SVT w/ ? of afib.  Eliquis started in light of h/o LE embolism.   Primary osteoarthritis of both knees    PSVT (paroxysmal supraventricular tachycardia) (HCC)    a. 01/2021 Zio: 53 SVT runs. Longest/fastest 13.1 secs @ 197. Some SVT possibly AT vs Afib-->Eliquis started.   Raynaud's disease without gangrene    Rheumatoid factor positive    Stricture and stenosis of esophagus    Past Surgical History:  Procedure Laterality Date   CESAREAN SECTION     ESOPHAGOGASTRODUODENOSCOPY N/A 02/21/2020   Procedure: ESOPHAGOGASTRODUODENOSCOPY (EGD);  Surgeon: Midge Minium, MD;  Location: [facility] ENDOSCOPY;  Service: Endoscopy;  Laterality: N/A;   ESOPHAGOGASTRODUODENOSCOPY (EGD) WITH PROPOFOL N/A 04/20/2020   Procedure: ESOPHAGOGASTRODUODENOSCOPY (EGD) WITH PROPOFOL;  Surgeon: Midge Minium, MD;  Location[facility] ENDOSCOPY;  Service: Endoscopy;  Laterality: N/A;   LOWER  EXTREMITY ANGIOGRAPHY Right 11/18/2020   Procedure: Lower Extremity Angiography;  Surgeon: Annice Needy, MD;  Location: [facility] INVASIVE CV LAB;  Service: Cardiovascular;  Laterality: Right;   VASCULAR SURGERY      Allergies  Allergies  Allergen Reactions   Codeine Nausea Only   Epinephrine Palpitations    History of Present Illness    65 year old female with above past medical history including lower extremity arterial embolism, Raynaud's disease, GERD, ADD, and obesity.  In April 2022, she was admitted with acute right foot pain secondary to acute limb ischemia.  Angiogram showed abrupt occlusion of the right popliteal artery as well as occlusion of all 3 tibial vessels in the proximal segment.  She underwent thrombectomy and balloon angioplasty.  She was subsequently placed on Eliquis.  Event monitoring in June 2022 showed 53 episodes of SVT, the fastest 197 bpm x 13.1 seconds.  Some SVT noted was felt to potentially represent atrial tachycardia versus atrial fibrillation.  In the setting of prior embolism, recommendations made for continued oral anticoagulation.  Echocardiogram earlier this month showed normal LV function (60-65%), with trivial MR.  Since her last visit, she has felt well.  She has been tolerating Eliquis, though has noted intermittent hematuria, but none in the past few wks.  She has a f/u pending w/ urology.  She is still pending CTA of the chest/abd/pelvis for eval of Ao thrombus/ulceration.  She is worried about her kidney's, in the setting of  intermittent hematuria, though renal fxn was nl in June.  She denies chest pain, dyspnea, palpitations, PND, orthopnea, dizziness, syncope, or early satiety.  She sometimes notes lower extremity swelling, especially if she is sitting for long periods of time.  Home Medications    Prior to Admission medications   Medication Sig Start Date End Date Taking? Authorizing Provider  amphetamine-dextroamphetamine (ADDERALL) 20 MG tablet Take  one tablet in the morning and one tablet in the afternoon. 05/25/14   Sherlene Shams, MD  apixaban (ELIQUIS) 5 MG TABS tablet Take 1 tablet (5 mg total) by mouth 2 (two) times daily. 11/19/20 02/17/21  Darlin Priestly, MD  aspirin EC 81 MG EC tablet Take 1 tablet (81 mg total) by mouth daily. Swallow whole. 11/20/20   Darlin Priestly, MD  atorvastatin (LIPITOR) 10 MG tablet Take 1 tablet (10 mg total) by mouth daily. 11/20/20 02/18/21  Darlin Priestly, MD    Review of Systems    Occasional lower extremity swelling but overall doing well.  Has noted intermittent hematuria w/ urology w/u pending.  She denies chest pain, dyspnea, palpitations, PND, orthopnea, dizziness, syncope, or early satiety.  All other systems reviewed and are otherwise negative except as noted above.  Physical Exam    VS:  BP (!) 150/90 (BP Location: Left Arm, Patient Position: Sitting, Cuff Size: Large)   Pulse 89   Ht 5\' 2"  (1.575 m)   Wt 239 lb (108.4 kg)   SpO2 98%   BMI 43.71 kg/m  , BMI Body mass index is 43.71 kg/m. STOP-Bang Score:  4    STOP-Bang Score:  4      GEN: Well nourished, well developed, in no acute distress. HEENT: normal. Neck: Supple, no JVD, carotid bruits, or masses. Cardiac: RRR, no murmurs, rubs, or gallops. No clubbing, cyanosis, trace bilateral ankle edema.  Radials 2+/DP 1+ and equal bilaterally.  Respiratory:  Respirations regular and unlabored, clear to auscultation bilaterally. GI: Soft, nontender, nondistended, BS + x 4. MS: no deformity or atrophy. Skin: warm and dry, no rash. Neuro:  Strength and sensation are intact. Psych: Normal affect.  Accessory Clinical Findings    Lab Results  Component Value Date   WBC 8.8 12/15/2020   HGB 14.3 12/15/2020   HCT 44.9 12/15/2020   MCV 88.7 12/15/2020   PLT 288 12/15/2020   Lab Results  Component Value Date   CREATININE 0.85 01/28/2021   BUN 12 01/28/2021   NA 139 01/28/2021   K 4.1 01/28/2021   CL 106 01/28/2021   CO2 26 01/28/2021   Lab Results   Component Value Date   ALT 25 12/15/2020   AST 22 12/15/2020   ALKPHOS 105 12/15/2020   BILITOT 0.4 12/15/2020   Lab Results  Component Value Date   CHOL 209 (H) 11/18/2012   HDL 48.30 11/18/2012   LDLCALC 115 (H) 11/22/2011   LDLDIRECT 139.9 11/18/2012   TRIG 172.0 (H) 11/18/2012   CHOLHDL 4 11/18/2012    Assessment & Plan    1.  Paroxysmal atrial fibrillation/PSVT: In the setting of right lower extremity arterial embolism and reported palpitations, patient recently wore a Zio monitor which showed brief runs of PSVT with question of paroxysmal atrial fibrillation.  Recommendation was made for continuation of long-term oral anticoagulation.  Echo has shown normal LV function without significant valvular disease.  We discussed the diagnosis of atrial fibrillation and the role of oral anticoagulation today.  In light of history of palpitations and findings on monitor,  along with elevated blood pressure, I have added metoprolol 25 mg twice daily.  I have also arranged for a watch pat 1 given elevated STOP-BANG score and risk of sleep apnea.  2.  Right lower extremity arterial thrombus: Status post thrombectomy and balloon angioplasty earlier this year.  Doing well.  As above, will continue on Eliquis therapy long-term.  CTA of the chest/abd/pelvis prev ordered to eval for ulcerated plaque/thrombus in aorta.  She has not had this done yet and would like to wait until after urology visit for intermittent hematuria.   3.  Sleep disordered breathing: Home sleep study arranged today.  Stop bang equals 4.  4.  Hyperlipidemia: LDL of 93 in May.  This is been followed by primary care.  She remains on statin therapy.  5.  Essential hypertension: Blood pressure is elevated again today.  Adding metoprolol 25 mg twice daily in the setting of above.  6.  Intermittent hematuria:  Has noted this on two occasions since May.  Currently resolved.  Has f/u w/ urology planned.  7.  Disposition: Follow-up at  home sleep study.  Follow-up in clinic in 3 months or sooner if necessary.  Nicolasa Ducking, NP 02/24/2021, 6:09 PM

## 2021-02-24 NOTE — Patient Instructions (Signed)
Medication Instructions:  Your physician has recommended you make the following change in your medication:   START Metoprolol Tartrate 25 mg twice a day  *If you need a refill on your cardiac medications before your next appointment, please call your pharmacy*   Lab Work: None  If you have labs (blood work) drawn today and your tests are completely normal, you will receive your results only by: MyChart Message (if you have MyChart) OR A paper copy in the mail If you have any lab test that is abnormal or we need to change your treatment, we will call you to review the results.   Testing/Procedures: WatchPAT?  Is a FDA cleared portable home sleep study test that uses a watch and 3 points of contact to monitor 7 different channels, including your heart rate, oxygen saturations, body position, snoring, and chest motion.  The study is easy to use from the comfort of your own home and accurately detect sleep apnea.  Before bed, you attach the chest sensor, attached the sleep apnea bracelet to your nondominant hand, and attach the finger probe.  After the study, the raw data is downloaded from the watch and scored for apnea events.   For more information: [email or web address]    Follow-Up: At [facility], you and your health needs are our priority.  As part of our continuing mission to provide you with exceptional heart care, we have created designated Provider Care Teams.  These Care Teams include your primary Cardiologist (physician) and Advanced Practice Providers (APPs -  Physician Assistants and Nurse Practitioners) who all work together to provide you with the care you need, when you need it.   Your next appointment:   3 month(s)  The format for your next appointment:   In Person  Provider:   Lorine Bears, MD or Nicolasa Ducking, NP

## 2021-02-25 ENCOUNTER — Telehealth: Payer: Self-pay | Admitting: *Deleted

## 2021-02-25 NOTE — Telephone Encounter (Signed)
-----   Message from Bryna Colander, RN sent at 02/24/2021 10:02 AM EDT ----- Regarding: WatchPat Please obtain authorization for this patients WatchPat and let me know when she can proceed.

## 2021-02-25 NOTE — Telephone Encounter (Signed)
Prior Authorization for [employer] sent to [employer] via web portal. Tracking Number [id number].

## 2021-03-01 NOTE — Telephone Encounter (Signed)
Received  "Out of Network" approval for Jade Edwards. Before patient activates the device, please inform her that  she was approved and billed as out of network. This will more than likely cost her more out of pocket.  Authorization #  [id number]. Valid dates 02/25/21 to 04/25/21.

## 2021-03-02 ENCOUNTER — Other Ambulatory Visit: Payer: Self-pay

## 2021-03-02 ENCOUNTER — Encounter: Payer: Self-pay | Admitting: Urology

## 2021-03-02 ENCOUNTER — Ambulatory Visit: Payer: BC Managed Care – PPO | Admitting: Urology

## 2021-03-02 VITALS — BP 177/83 | HR 92 | Ht 62.0 in | Wt 240.0 lb

## 2021-03-02 DIAGNOSIS — R310 Gross hematuria: Secondary | ICD-10-CM

## 2021-03-02 LAB — MICROSCOPIC EXAMINATION
Bacteria, UA: NONE SEEN
RBC, Urine: NONE SEEN /hpf (ref 0–2)
WBC, UA: NONE SEEN /hpf (ref 0–5)

## 2021-03-02 LAB — URINALYSIS, COMPLETE
Bilirubin, UA: NEGATIVE
Glucose, UA: NEGATIVE
Ketones, UA: NEGATIVE
Leukocytes,UA: NEGATIVE
Nitrite, UA: NEGATIVE
Protein,UA: NEGATIVE
RBC, UA: NEGATIVE
Specific Gravity, UA: 1.015 (ref 1.005–1.030)
Urobilinogen, Ur: 0.2 mg/dL (ref 0.2–1.0)
pH, UA: 6 (ref 5.0–7.5)

## 2021-03-02 NOTE — Progress Notes (Signed)
03/02/2021 3:34 PM   Jade Edwards 1956-05-31 [id number]  Referring provider: Ermalinda Memos, MD [address] [place],  [place] [address]  Chief Complaint  Patient presents with   Hematuria    HPI: 65 year old female who presents today for further evaluation of painless gross hematuria.  Notably, she developed an acute PE/DVT in April 2022 of unclear etiology.  She is undergone extensive evaluation for this including cardiac and hematologic without any clear definitive cause.  Since this time, she has been on chronic anticoagulation in the form of Eliquis which she continues.  She developed 2 episodes of painless gross hematuria, once on May 12 which lasted approximately 5 days and then again on June 13 which lasted 3 days.  She had no other associated urinary symptoms occluding no dysuria, gross hematuria, or any other signs or symptoms of infection.  She had her urine evaluated and no infection was identified.  She does have multiple medical comorbidities as outlined below.  She mentions today she is worried about her renal function.  She reports that it waxes and wanes between normal and abnormal.  Its worse when she gets a little dehydrated or takes ibuprofen.  She has to take chronic NSAIDs off and on due to severe knee pain/inflammation.  She does have a personal history of gout and has had several recent gouty flares.  She reports that her uric acid levels have been elevated.  Her rheumatologist was considering allopurinol but never ended up prescribing this medication.  She just got over an acute episode.  She is also recently been taking Keflex for possible cellulitis although this is more likely a gout flare.  At baseline, she has some urinary frequency but otherwise no urinary issues.  She is a never smoker.  No industrial chemical exposure.  She had a noncontrast CT scan of the abdomen pelvis performed in May 2020 for the evaluation of her gross hematuria.   Unfortunately, this was during a contrast shortage and likely why was ordered without contrast.  She also reports that they were thinking that it may have been a stone.  This showed no GU pathology.   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 11/2020 Angio: 100% R popliteal artery and all 3 tibial vessels in the proximal segment-->s/p thrombectomy & PTA.   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 02/2021 Echo: EF 60-65%, no rwma. Nl RV fxn. Triv MR.   Obesity    Osteoarthritis of right knee    PAF (paroxysmal atrial fibrillation) (HCC)    a. 01/2021 Zio: 53 runs SVT w/ ? of afib.  Eliquis started in light of h/o LE embolism.   Primary osteoarthritis of both knees    PSVT (paroxysmal supraventricular tachycardia) (HCC)    a. 01/2021 Zio: 53 SVT runs. Longest/fastest 13.1 secs @ 197. Some SVT possibly AT vs Afib-->Eliquis started.   Raynaud's disease without gangrene    Rheumatoid factor positive    Stricture and stenosis of esophagus     Surgical History: Past Surgical History:  Procedure Laterality Date   CESAREAN SECTION  ESOPHAGOGASTRODUODENOSCOPY N/A 02/21/2020   Procedure: ESOPHAGOGASTRODUODENOSCOPY (EGD);  Surgeon: Midge Minium, MD;  Location: [facility] ENDOSCOPY;  Service: Endoscopy;  Laterality: N/A;   ESOPHAGOGASTRODUODENOSCOPY (EGD) WITH PROPOFOL N/A 04/20/2020   Procedure: ESOPHAGOGASTRODUODENOSCOPY (EGD) WITH PROPOFOL;  Surgeon: Midge Minium, MD;  Location: [facility] ENDOSCOPY;  Service: Endoscopy;  Laterality: N/A;   LOWER EXTREMITY ANGIOGRAPHY Right 11/18/2020   Procedure: Lower Extremity Angiography;  Surgeon: Annice Needy, MD;  Location: [facility] INVASIVE CV LAB;   Service: Cardiovascular;  Laterality: Right;   VASCULAR SURGERY      Home Medications:  Allergies as of 03/02/2021       Reactions   Codeine Nausea Only   Epinephrine Palpitations        Medication List        Accurate as of March 02, 2021  3:34 PM. If you have any questions, ask your nurse or doctor.          amphetamine-dextroamphetamine 20 MG tablet Commonly known as: Adderall Take one tablet in the morning and one tablet in the afternoon.   apixaban 5 MG Tabs tablet Commonly known as: ELIQUIS Take 1 tablet (5 mg total) by mouth 2 (two) times daily.   aspirin 81 MG EC tablet Take 1 tablet (81 mg total) by mouth daily. Swallow whole.   atorvastatin 10 MG tablet Commonly known as: LIPITOR Take 1 tablet (10 mg total) by mouth daily.   metoprolol tartrate 25 MG tablet Commonly known as: LOPRESSOR Take 1 tablet (25 mg total) by mouth 2 (two) times daily.        Allergies:  Allergies  Allergen Reactions   Codeine Nausea Only   Epinephrine Palpitations    Family History: Family History  Problem Relation Age of Onset   Mental illness Mother    Bipolar disorder Mother    Breast cancer Mother    Depression Mother    Other Mother        DVTs   Lupus Mother    Hyperlipidemia Mother    Rheum arthritis Mother    Stroke Mother    Coronary artery disease Father    Heart attack Father 73   Other Sister        blood clots   Osteoarthritis Daughter    Osteoarthritis Son    Bladder Cancer Neg Hx    Prostate cancer Neg Hx    Kidney cancer Neg Hx     Social History:  reports that she has never smoked. She has never used smokeless tobacco. She reports that she does not drink alcohol and does not use drugs.   Physical Exam: BP (!) 177/83   Pulse 92   Ht 5\' 2"  (1.575 m)   Wt 240 lb (108.9 kg)   BMI 43.90 kg/m   Constitutional:  Alert and oriented, No acute distress. HEENT: [place] AT, moist mucus membranes.  Trachea midline, no masses. Cardiovascular: No  clubbing, cyanosis, or edema. Respiratory: Normal respiratory effort, no increased work of breathing. Skin: No rashes, bruises or suspicious lesions. Neurologic: Grossly intact, no focal deficits, moving all 4 extremities. Psychiatric: Normal mood and affect.  Laboratory Data: Lab Results  Component Value Date   WBC 8.8 12/15/2020   HGB 14.3 12/15/2020   HCT 44.9 12/15/2020   MCV 88.7 12/15/2020   PLT 288 12/15/2020    Lab Results  Component Value Date   CREATININE 0.85 01/28/2021    Urinalysis UA today is negative  Pertinent Imaging:   Assessment & Plan:    1.  Gross hematuria Lengthy discussion today with differential diagnosis of gross hematuria  We discussed that even on anticoagulation, gross hematuria should still be evaluated.  Other than her age and degree of hematuria, she has no other risk factors but technically falls into the high risk category.  Unfortunately, the CT scan was performed without contrast was as well as ureters were incompletely evaluated with this study.  We discussed that we could pursue CT urogram versus a renal ultrasound.  The disadvantage of doing this, with the incomplete evaluation of primarily the ureters although the risk of upper tract urothelial carcinoma is extremely low.  Ultimately, care risk and benefits discussion, she is agreeable for renal ultrasound.  If she develops another episode of gross hematuria, we will definitely proceed CT urogram.  She is agreeable to cystoscopy we will have her follow-up after her renal ultrasound  She had a lot of questions today about her renal function as well as her uric acid levels.  Its possible that she may have passed a small stone and been having uric acid crystals in the setting of anticoagulation as the cause of her bleeding.  Advised to follow-up with her rheumatologist to discuss whether or not it makes sense for her to be on allopurinol.  Degree of hematuria is not severe and self-limited  this appropriate to continue Eliquis.  - Urinalysis, Complete - Cytology - Non PAP; - US RENAL; Future   Return for cysto w/RUS results.  Vanna Scotland, MD  [facility] [address] [place], [place] [address] [phone]

## 2021-03-02 NOTE — Patient Instructions (Signed)
Cystoscopy Cystoscopy is a procedure that is used to help diagnose and sometimes treat conditions that affect the lower urinary tract. The lower urinary tract includes the bladder and the urethra. The urethra is the tube that drains urine from the bladder. Cystoscopy is done using a thin, tube-shaped instrument with a light and camera at the end (cystoscope). The cystoscope may be hard or flexible, depending on the goal of the procedure. The cystoscope is inserted through the urethra, into the bladder. Cystoscopy may be recommended if you have: Urinary tract infections that keep coming back. Blood in the urine (hematuria). An inability to control when you urinate (urinary incontinence) or an overactive bladder. Unusual cells found in a urine sample. A blockage in the urethra, such as a urinary stone. Painful urination. An abnormality in the bladder found during an intravenous pyelogram (IVP) or CT scan. Cystoscopy may also be done to remove a sample of tissue to be examined under a microscope (biopsy). What are the risks? Generally, this is a safe procedure. However, problems may occur, including: Infection. Bleeding.  What happens during the procedure?  You will be given one or more of the following: A medicine to numb the area (local anesthetic). The area around the opening of your urethra will be cleaned. The cystoscope will be passed through your urethra into your bladder. Germ-free (sterile) fluid will flow through the cystoscope to fill your bladder. The fluid will stretch your bladder so that your health care provider can clearly examine your bladder walls. Your doctor will look at the urethra and bladder. The cystoscope will be removed The procedure may vary among health care providers  What can I expect after the procedure? After the procedure, it is common to have: Some soreness or pain in your abdomen and urethra. Urinary symptoms. These include: Mild pain or burning when you  urinate. Pain should stop within a few minutes after you urinate. This may last for up to 1 week. A small amount of blood in your urine for several days. Feeling like you need to urinate but producing only a small amount of urine. Follow these instructions at home: General instructions Return to your normal activities as told by your health care provider.  Do not drive for 24 hours if you were given a sedative during your procedure. Watch for any blood in your urine. If the amount of blood in your urine increases, call your health care provider. If a tissue sample was removed for testing (biopsy) during your procedure, it is up to you to get your test results. Ask your health care provider, or the department that is doing the test, when your results will be ready. Drink enough fluid to keep your urine pale yellow. Keep all follow-up visits as told by your health care provider. This is important. Contact a health care provider if you: Have pain that gets worse or does not get better with medicine, especially pain when you urinate. Have trouble urinating. Have more blood in your urine. Get help right away if you: Have blood clots in your urine. Have abdominal pain. Have a fever or chills. Are unable to urinate. Summary Cystoscopy is a procedure that is used to help diagnose and sometimes treat conditions that affect the lower urinary tract. Cystoscopy is done using a thin, tube-shaped instrument with a light and camera at the end. After the procedure, it is common to have some soreness or pain in your abdomen and urethra. Watch for any blood in your urine.   If the amount of blood in your urine increases, call your health care provider. If you were prescribed an antibiotic medicine, take it as told by your health care provider. Do not stop taking the antibiotic even if you start to feel better. This information is not intended to replace advice given to you by your health care provider. Make  sure you discuss any questions you have with your health care provider. Document Revised: 07/30/2018 Document Reviewed: 07/30/2018 Elsevier Patient Education  2020 [employer]  

## 2021-03-02 NOTE — Telephone Encounter (Signed)
Left voicemail message for patient to call back for review of information and authorization.

## 2021-03-02 NOTE — Telephone Encounter (Signed)
Spoke with patient and updated her that it was approved but that it was out of network. She verbalized understanding and verbally stated she was aware and wanted to proceed with the test. Provided her with pin number and she will move forward with testing. She was appreciative for the call and update with no further questions at this time.

## 2021-03-08 ENCOUNTER — Other Ambulatory Visit (INDEPENDENT_AMBULATORY_CARE_PROVIDER_SITE_OTHER): Payer: Self-pay | Admitting: Vascular Surgery

## 2021-03-16 ENCOUNTER — Inpatient Hospital Stay: Payer: BC Managed Care – PPO | Admitting: Internal Medicine

## 2021-03-16 ENCOUNTER — Inpatient Hospital Stay: Payer: BC Managed Care – PPO

## 2021-03-17 ENCOUNTER — Inpatient Hospital Stay: Payer: BC Managed Care – PPO | Attending: Internal Medicine

## 2021-03-17 ENCOUNTER — Encounter: Payer: Self-pay | Admitting: Internal Medicine

## 2021-03-17 ENCOUNTER — Inpatient Hospital Stay: Payer: BC Managed Care – PPO | Admitting: Internal Medicine

## 2021-03-17 DIAGNOSIS — I7300 Raynaud's syndrome without gangrene: Secondary | ICD-10-CM | POA: Insufficient documentation

## 2021-03-17 DIAGNOSIS — I771 Stricture of artery: Secondary | ICD-10-CM | POA: Insufficient documentation

## 2021-03-17 DIAGNOSIS — D6859 Other primary thrombophilia: Secondary | ICD-10-CM

## 2021-03-17 DIAGNOSIS — I739 Peripheral vascular disease, unspecified: Secondary | ICD-10-CM

## 2021-03-17 DIAGNOSIS — Z7901 Long term (current) use of anticoagulants: Secondary | ICD-10-CM | POA: Insufficient documentation

## 2021-03-17 LAB — CBC WITH DIFFERENTIAL/PLATELET
Abs Immature Granulocytes: 0.04 10*3/uL (ref 0.00–0.07)
Basophils Absolute: 0 10*3/uL (ref 0.0–0.1)
Basophils Relative: 0 %
Eosinophils Absolute: 0.1 10*3/uL (ref 0.0–0.5)
Eosinophils Relative: 0 %
HCT: 45.9 % (ref 36.0–46.0)
Hemoglobin: 14.5 g/dL (ref 12.0–15.0)
Immature Granulocytes: 0 %
Lymphocytes Relative: 26 %
Lymphs Abs: 3 10*3/uL (ref 0.7–4.0)
MCH: 27.7 pg (ref 26.0–34.0)
MCHC: 31.6 g/dL (ref 30.0–36.0)
MCV: 87.6 fL (ref 80.0–100.0)
Monocytes Absolute: 0.7 10*3/uL (ref 0.1–1.0)
Monocytes Relative: 6 %
Neutro Abs: 7.5 10*3/uL (ref 1.7–7.7)
Neutrophils Relative %: 68 %
Platelets: 347 10*3/uL (ref 150–400)
RBC: 5.24 MIL/uL — ABNORMAL HIGH (ref 3.87–5.11)
RDW: 14.2 % (ref 11.5–15.5)
WBC: 11.3 10*3/uL — ABNORMAL HIGH (ref 4.0–10.5)
nRBC: 0 % (ref 0.0–0.2)

## 2021-03-17 LAB — BASIC METABOLIC PANEL
Anion gap: 9 (ref 5–15)
BUN: 17 mg/dL (ref 8–23)
CO2: 26 mmol/L (ref 22–32)
Calcium: 9.2 mg/dL (ref 8.9–10.3)
Chloride: 105 mmol/L (ref 98–111)
Creatinine, Ser: 0.82 mg/dL (ref 0.44–1.00)
GFR, Estimated: >60 mL/min (ref >60)
Glucose, Bld: 98 mg/dL (ref 70–99)
Potassium: 3.8 mmol/L (ref 3.5–5.1)
Sodium: 140 mmol/L (ref 135–145)

## 2021-03-17 NOTE — Assessment & Plan Note (Addendum)
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  S/p thrombectomy/stent placement currently on Eliquis. As per cardiology- long term anticoagulation is recommended; defer to cardiology for anticoagulation management.  Discussed with Dr. Kirke Corin.  #Etiology: The etiology is unclear given arterial thrombosis-important to rule out primary thrombotic causes ruled out. ? paroxsymal A.fib.  Extensive primary hypercoagulable work-up-negative for any obvious etiology.  # Obesity: Discussed importance of healthy weight/and weight loss.  Strongly recommend eating more green leafy vegetables and cutting down processed food/ carbohydrates.  Instead increasing whole grains / protein in the diet.  Multiple studies have shown that optimal weight would help improve cardiovascular risk; also shown to cut on the risk of malignancies-colon cancer, breast cancer ovarian/uterine cancer in women and also prostate cancer in men. Patient awaiting appt with with nutritionist.    #History of Raynaud's; awaiting rheumatology evaluation.  # DISPOSITION:  ## follow up as needed-Dr.B

## 2021-03-17 NOTE — Progress Notes (Signed)
[facility] CONSULT NOTE  Patient Care Team: Rayetta HumphreyGeorge, Sionne A, MD as PCP - General (Family Medicine) [place] OuchArida, Muhammad A, MD as PCP - Cardiology (Cardiology)  CHIEF COMPLAINTS/PURPOSE OF CONSULTATION: DVT/PE  # April 2022- Right aretrial clot- Dr.Dew/Dr.Arida Cath/angrio- Normal common femoral artery, profunda femoris artery, and superficial femoral artery.  There was an abrupt occlusion of the popliteal artery at the level of the knee.  There was occlusion of all 3 tibial vessels in the proximal segment with reconstitution of all 3 tibial vessels in the middle segment.; s/p mechanical thrombectomy of right popliteal artery, tibioperoneal trunk, and posterior tibial arteries; Percutaneous transluminal angioplasty of the right tibioperoneal trunk and proximal posterior tibial artery; on ELIQUIS.  July 2022-hypercoagulable work-up negative.  # COVID end of JAN, 2022 [post vacination]   Oncology History   No history exists.     HISTORY OF PRESENTING ILLNESS:  Jade Edwards 65 y.o.  female patient with history of Raynaud's -diagnosed with right lower extremity arterial occlusion-currently on Eliquis is here for follow-up/review results of the hypercoagulable work-up.  Patient currently undergoing work-up with cardiology for paroxysmal A. fib.  Patient denies any blood in stools or black or stools.  Denies any nausea vomiting.  No fevers or chills.   Review of Systems  Constitutional:  Negative for chills, diaphoresis, fever, malaise/fatigue and weight loss.  HENT:  Negative for nosebleeds and sore throat.   Eyes:  Negative for double vision.  Respiratory:  Negative for cough, hemoptysis, sputum production, shortness of breath and wheezing.   Cardiovascular:  Negative for chest pain, palpitations, orthopnea and leg swelling.  Gastrointestinal:  Negative for abdominal pain, blood in stool, constipation, diarrhea, heartburn, melena, nausea and vomiting.  Genitourinary:   Negative for dysuria, frequency and urgency.  Musculoskeletal:  Positive for back pain and joint pain.  Skin: Negative.  Negative for itching and rash.  Neurological:  Negative for dizziness, tingling, focal weakness, weakness and headaches.  Endo/Heme/Allergies:  Does not bruise/bleed easily.  Psychiatric/Behavioral:  Negative for depression. The patient is not nervous/anxious and does not have insomnia.     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 11/2020 Angio: 100% R popliteal artery and all 3 tibial vessels in the proximal segment-->s/p thrombectomy & PTA.   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 02/2021 Echo: EF 60-65%, no rwma. Nl RV fxn. Triv MR.   Obesity    Osteoarthritis of right knee    PAF (paroxysmal atrial fibrillation) (HCC)    a. 01/2021 Zio: 53 runs SVT w/ ? of afib.  Eliquis started in light of h/o LE embolism.   Primary osteoarthritis of both knees    PSVT (paroxysmal supraventricular tachycardia) (HCC)    a. 01/2021 Zio: 53 SVT runs. Longest/fastest 13.1 secs @ 197. Some SVT possibly AT vs Afib-->Eliquis started.   Raynaud's disease without gangrene    Rheumatoid factor positive    Stricture and stenosis of esophagus     SURGICAL HISTORY: Past Surgical History:  Procedure Laterality Date   CESAREAN SECTION     ESOPHAGOGASTRODUODENOSCOPY N/A 02/21/2020  Procedure: ESOPHAGOGASTRODUODENOSCOPY (EGD);  Surgeon: Midge Minium, MD;  Location: [facility] ENDOSCOPY;  Service: Endoscopy;  Laterality: N/A;   ESOPHAGOGASTRODUODENOSCOPY (EGD) WITH PROPOFOL N/A 04/20/2020   Procedure: ESOPHAGOGASTRODUODENOSCOPY (EGD) WITH PROPOFOL;  Surgeon: Midge Minium, MD;  Location: [facility] ENDOSCOPY;  Service: Endoscopy;  Laterality: N/A;   LOWER EXTREMITY ANGIOGRAPHY Right 11/18/2020   Procedure: Lower Extremity Angiography;  Surgeon: Annice Needy, MD;  Location: [facility] INVASIVE CV LAB;  Service: Cardiovascular;  Laterality: Right;   VASCULAR SURGERY      SOCIAL HISTORY: Social History   Socioeconomic History   Marital status: Married    Spouse name: Not on file   Number of children: Not on file   Years of education: Not on file   Highest education level: Not on file  Occupational History   Not on file  Tobacco Use   Smoking status: Never   Smokeless tobacco: Never  Vaping Use   Vaping Use: Never used  Substance and Sexual Activity   Alcohol use: No   Drug use: No   Sexual activity: Not on file  Other Topics Concern   Not on file  Social History Narrative   No smoking; no alcohol. 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 Lives [place].    Social Determinants of Health   Financial Resource Strain: Not on file  Food Insecurity: Not on file  Transportation Needs: Not on file  Physical Activity: Not on file  Stress: Not on file  Social Connections: Not on file  Intimate Partner Violence: Not on file    FAMILY HISTORY: Family History  Problem Relation Age of Onset   Mental illness Mother    Bipolar disorder Mother    Breast cancer Mother    Depression Mother    Other Mother        DVTs   Lupus Mother    Hyperlipidemia Mother    Rheum arthritis Mother    Stroke Mother    Coronary artery disease Father    Heart attack Father 109   Other Sister        blood clots   Osteoarthritis Daughter    Osteoarthritis Son    Bladder Cancer Neg Hx    Prostate cancer Neg Hx    Kidney cancer Neg Hx     ALLERGIES:  is allergic to codeine and epinephrine.  MEDICATIONS:  Current Outpatient Medications  Medication Sig Dispense Refill   amphetamine-dextroamphetamine (ADDERALL) 20 MG tablet Take one  tablet in the morning and one tablet in the afternoon. 60 tablet 0   apixaban (ELIQUIS) 5 MG TABS tablet Take 1 tablet (5 mg total) by mouth 2 (two) times daily. 60 tablet 2   aspirin EC 81 MG EC tablet Take 1 tablet (81 mg total) by mouth daily. Swallow whole.     atorvastatin (LIPITOR) 10 MG tablet Take 1 tablet (10 mg total) by mouth daily. 30 tablet 2   metoprolol tartrate (LOPRESSOR) 25 MG tablet Take 1 tablet (25 mg total) by mouth 2 (two) times daily. 180 tablet 3   No current facility-administered medications for this visit.      Marland Kitchen  PHYSICAL EXAMINATION:  Vitals:   03/17/21 0837  BP: (!) 170/96  Pulse: 81  Resp: 16  Temp: 98.4 F (36.9 C)  SpO2: 100%   Filed Weights   03/17/21 0837  Weight: 241 lb (109.3 kg)    Physical Exam HENT:     Head: Normocephalic and atraumatic.  Mouth/Throat:     Pharynx: No oropharyngeal exudate.  Eyes:     Pupils: Pupils are equal, round, and reactive to light.  Cardiovascular:     Rate and Rhythm: Normal rate and regular rhythm.  Pulmonary:     Effort: Pulmonary effort is normal. No respiratory distress.     Breath sounds: Normal breath sounds. No wheezing.  Abdominal:     General: Bowel sounds are normal. There is no distension.     Palpations: Abdomen is soft. There is no mass.     Tenderness: no abdominal tenderness There is no guarding or rebound.  Musculoskeletal:        General: No tenderness. Normal range of motion.     Cervical back: Normal range of motion and neck supple.  Skin:    General: Skin is warm.     Comments: Dusky discoloration of the bilateral toes; right more than left.  Feeble pulses felt in the right dorsalis pedis/posterior tibial  Neurological:     Mental Status: She is alert and oriented to person, place, and time.  Psychiatric:        Mood and Affect: Affect normal.     LABORATORY DATA:  I have reviewed the data as listed Lab Results  Component Value Date   WBC 11.3 (H) 03/17/2021   HGB  14.5 03/17/2021   HCT 45.9 03/17/2021   MCV 87.6 03/17/2021   PLT 347 03/17/2021   Recent Labs    11/18/20 1016 11/19/20 0349 12/15/20 1214 01/14/21 1101 01/28/21 1000 03/17/21 0821  NA 139   < > 139 CANCELED 139 140  K 3.8   < > 3.8 CANCELED 4.1 3.8  CL 104   < > 101 CANCELED 106 105  CO2 24   < > 27 CANCELED 26 26  GLUCOSE 114*   < > 93 80 107* 98  BUN 18   < > CREATININE 1.01*   < > 0.91 0.96 0.85 0.82  CALCIUM 8.8*   < > 8.8* CANCELED 8.7* 9.2  GFRNONAA >60   < > >60  --  >60 >60  PROT 7.3  --  7.6  --   --   --   ALBUMIN 3.8  --  4.2  --   --   --   AST 23  --  22  --   --   --   ALT 19  --  25  --   --   --   ALKPHOS 99  --  105  --   --   --   BILITOT 0.5  --  0.4  --   --   --   BILIDIR 0.1  --   --   --   --   --   IBILI 0.4  --   --   --   --   --    < > = values in this interval not displayed.    RADIOGRAPHIC STUDIES: I have personally reviewed the radiological images as listed and agreed with the findings in the report. VAS [place] ABI WITH/WO TBI  Result Date: 03/18/2021  LOWER EXTREMITY DOPPLER STUDY Patient Name:  MICHAIAH MAIDEN  Date of Exam:   03/18/2021 Medical Rec #: [id number]          Accession #:    [id number] Date of Birth: Jun 30, 1956          Patient Gender: F Patient Age:   79Y Exam Location:  [facility] Procedure:      VAS [place] ABI WITH/WO TBI Referring Phys: [id number] JASON S DEW --------------------------------------------------------------------------------   Vascular Interventions: 11/18/2020 rt thrombectomy popliteal distally. Comparison Study: 12/17/2020 Performing Technologist: Reece Agar RT (R)(VS)  Examination Guidelines: A complete evaluation includes at minimum, Doppler waveform signals and systolic blood pressure reading at the level of bilateral brachial, anterior tibial, and posterior tibial arteries, when vessel segments are accessible. Bilateral testing is considered an integral part of a complete examination.  Photoelectric Plethysmograph (PPG) waveforms and toe systolic pressure readings are included as required and additional duplex testing as needed. Limited examinations for reoccurring indications may be performed as noted.  ABI Findings: +---------+------------------+-----+----------+--------+ Right    Rt Pressure (mmHg)IndexWaveform  Comment  +---------+------------------+-----+----------+--------+ Brachial 169                                       +---------+------------------+-----+----------+--------+ ATA      146               0.80 monophasic         +---------+------------------+-----+----------+--------+ PTA      195               1.07 biphasic           +---------+------------------+-----+----------+--------+ Great Toe                       Absent             +---------+------------------+-----+----------+--------+ +---------+------------------+-----+---------+-------+ Left     Lt Pressure (mmHg)IndexWaveform Comment +---------+------------------+-----+---------+-------+ Brachial 183                                     +---------+------------------+-----+---------+-------+ ATA      196               1.07 triphasic        +---------+------------------+-----+---------+-------+ PTA      186               1.02 triphasic        +---------+------------------+-----+---------+-------+ Great Toe96                0.52 Abnormal         +---------+------------------+-----+---------+-------+ +-------+-----------+-----------+------------+------------+ ABI/TBIToday's ABIToday's TBIPrevious ABIPrevious TBI +-------+-----------+-----------+------------+------------+ Right  1.07       0          1.12        .57          +-------+-----------+-----------+------------+------------+ Left   1.07       .52        1.21        .54          +-------+-----------+-----------+------------+------------+ Bilateral ABIs appear essentially unchanged compared to  prior study on 12/17/2020.  Summary: Right: Resting right ankle-brachial index is within normal range. No evidence of significant right lower extremity arterial disease. The right toe-brachial index is abnormal. Right TBI was not detected. Left: Resting left ankle-brachial index is within normal range. No evidence of significant left lower extremity arterial disease. The left toe-brachial index is normal. Left TBI appears essentially unchanged as compared to the previous exam on 12/17/2020.  *See table(s) above for measurements and observations.  Electronically signed by Festus Barren MD on 03/18/2021 at 12:21:57 PM.  Final      ASSESSMENT & PLAN:   Lower extremity arterial insufficiency, severe, right (HCC) #Right popliteal artery occlusion/likely embolic-unclear etiology.  S/p thrombectomy/stent placement currently on Eliquis. As per cardiology- long term anticoagulation is recommended; defer to cardiology for anticoagulation management.  Discussed with Dr. Kirke Corin.  #Etiology: The etiology is unclear given arterial thrombosis-important to rule out primary thrombotic causes ruled out. ? paroxsymal A.fib.  Extensive primary hypercoagulable work-up-negative for any obvious etiology.  # Obesity: Discussed importance of healthy weight/and weight loss.  Strongly recommend eating more green leafy vegetables and cutting down processed food/ carbohydrates.  Instead increasing whole grains / protein in the diet.  Multiple studies have shown that optimal weight would help improve cardiovascular risk; also shown to cut on the risk of malignancies-colon cancer, breast cancer ovarian/uterine cancer in women and also prostate cancer in men. Patient awaiting appt with with nutritionist.    #History of Raynaud's; awaiting rheumatology evaluation.  # DISPOSITION:  ## follow up as needed-Dr.B  Questions were answered. The patient knows to call the clinic with any problems, questions or concerns.    Earna Coder,  MD 03/31/2021 11:16 PM

## 2021-03-18 ENCOUNTER — Other Ambulatory Visit: Payer: Self-pay

## 2021-03-18 ENCOUNTER — Ambulatory Visit (INDEPENDENT_AMBULATORY_CARE_PROVIDER_SITE_OTHER): Payer: BC Managed Care – PPO

## 2021-03-18 ENCOUNTER — Ambulatory Visit (INDEPENDENT_AMBULATORY_CARE_PROVIDER_SITE_OTHER): Payer: BC Managed Care – PPO | Admitting: Vascular Surgery

## 2021-03-18 ENCOUNTER — Encounter (INDEPENDENT_AMBULATORY_CARE_PROVIDER_SITE_OTHER): Payer: Self-pay | Admitting: Vascular Surgery

## 2021-03-18 VITALS — BP 164/83 | HR 68 | Ht 62.0 in | Wt 238.0 lb

## 2021-03-18 DIAGNOSIS — I739 Peripheral vascular disease, unspecified: Secondary | ICD-10-CM

## 2021-03-18 DIAGNOSIS — I83811 Varicose veins of right lower extremities with pain: Secondary | ICD-10-CM | POA: Diagnosis not present

## 2021-03-18 DIAGNOSIS — I7300 Raynaud's syndrome without gangrene: Secondary | ICD-10-CM | POA: Diagnosis not present

## 2021-03-18 NOTE — Progress Notes (Signed)
MRN : [id number]  Jade Edwards is a 65 y.o. (07-31-1956) female who presents with chief complaint of  Chief Complaint  Patient presents with   Follow-up    3 Mo [place]  .  History of Present Illness: Patient returns today in follow up of her right lower extremity ischemia from an embolic event about 4 months ago.  She was treated with thrombectomy and we were able to restore flow although her toes remain significantly discolored and painful at that time.  Over the past several months, her toes have improved and they are color although they still remain somewhat discolored most of the time.  She also has Raynaud's which muddy the water a bit on that.  The pain in her toes is basically gone now.  No ulceration or infection.  No fevers or chills.  ABIs today are normal at 1.07 on the right and the left although the digital waveforms are significantly reduced on the right.  Current Outpatient Medications  Medication Sig Dispense Refill   amphetamine-dextroamphetamine (ADDERALL) 20 MG tablet Take one tablet in the morning and one tablet in the afternoon. 60 tablet 0   apixaban (ELIQUIS) 5 MG TABS tablet Take 1 tablet (5 mg total) by mouth 2 (two) times daily. 60 tablet 2   aspirin EC 81 MG EC tablet Take 1 tablet (81 mg total) by mouth daily. Swallow whole.     atorvastatin (LIPITOR) 10 MG tablet Take 1 tablet (10 mg total) by mouth daily. 30 tablet 2   metoprolol tartrate (LOPRESSOR) 25 MG tablet Take 1 tablet (25 mg total) by mouth 2 (two) times daily. 180 tablet 3   No current facility-administered medications for this visit.    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 11/2020 Angio: 100% R popliteal artery and all 3 tibial vessels in the proximal segment-->s/p thrombectomy & PTA.   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 02/2021 Echo: EF 60-65%, no rwma. Nl RV fxn. Triv MR.   Obesity    Osteoarthritis of right knee    PAF (paroxysmal atrial fibrillation) (HCC)    a. 01/2021 Zio: 53 runs SVT w/ ? of afib.  Eliquis started in light of h/o LE embolism.   Primary osteoarthritis of both knees    PSVT (paroxysmal supraventricular tachycardia) (HCC)    a. 01/2021 Zio: 53 SVT runs. Longest/fastest 13.1 secs @ 197. Some SVT possibly AT vs Afib-->Eliquis started.   Raynaud's disease without gangrene    Rheumatoid factor positive    Stricture and stenosis of esophagus     Past Surgical History:  Procedure Laterality Date   CESAREAN SECTION     ESOPHAGOGASTRODUODENOSCOPY N/A 02/21/2020   Procedure: ESOPHAGOGASTRODUODENOSCOPY (EGD);  Surgeon: Midge Minium, MD;  Location: [facility] ENDOSCOPY;  Service: Endoscopy;  Laterality: N/A;   ESOPHAGOGASTRODUODENOSCOPY (EGD) WITH PROPOFOL N/A 04/20/2020   Procedure: ESOPHAGOGASTRODUODENOSCOPY (EGD) WITH PROPOFOL;  Surgeon: Midge Minium, MD;  Location: [facility] ENDOSCOPY;  Service: Endoscopy;  Laterality: N/A;   LOWER EXTREMITY ANGIOGRAPHY Right 11/18/2020   Procedure: Lower Extremity Angiography;  Surgeon: Annice Needy, MD;  Location:  [facility];  Service: Cardiovascular;  Laterality: Right;   VASCULAR SURGERY       Social History   Tobacco Use   Smoking status: Never   Smokeless tobacco: Never  Vaping Use   Vaping Use: Never used  Substance Use Topics   Alcohol use: No   Drug use: No      Family History  Problem Relation Age of Onset   Mental illness Mother    Bipolar disorder Mother    Breast cancer Mother    Depression Mother    Other Mother        DVTs   Lupus Mother    Hyperlipidemia Mother    Rheum arthritis Mother    Stroke Mother    Coronary artery disease Father    Heart attack Father 59    Other Sister        blood clots   Osteoarthritis Daughter    Osteoarthritis Son    Bladder Cancer Neg Hx    Prostate cancer Neg Hx    Kidney cancer Neg Hx      Allergies  Allergen Reactions   Codeine Nausea Only   Epinephrine Palpitations     REVIEW OF SYSTEMS (Negative unless checked)   Constitutional: [] Weight loss  [] Fever  [] Chills Cardiac: [] Chest pain   [] Chest pressure   [] Palpitations   [] Shortness of breath when laying flat   [] Shortness of breath at rest   [] Shortness of breath with exertion. Vascular:  [] Pain in legs with walking   [] Pain in legs at rest   [] Pain in legs when laying flat   [] Claudication   [] Pain in feet when walking  [] Pain in feet at rest  [] Pain in feet when laying flat   [] History of DVT   [] Phlebitis   [] Swelling in legs   [] Varicose veins   [] Non-healing ulcers Pulmonary:   [] Uses home oxygen   [] Productive cough   [] Hemoptysis   [] Wheeze  [] COPD   [] Asthma Neurologic:  [] Dizziness  [] Blackouts   [] Seizures   [] History of stroke   [] History of TIA  [] Aphasia   [] Temporary blindness   [] Dysphagia   [] Weakness or numbness in arms   [] Weakness or numbness in legs Musculoskeletal:  [x] Arthritis   [] Joint swelling   [x] Joint pain   [] Low back pain Hematologic:  [x] Easy bruising  [] Easy bleeding   [] Hypercoagulable state   [] Anemic   Gastrointestinal:  [] Blood in stool   [] Vomiting blood  [] Gastroesophageal reflux/heartburn   [] Abdominal pain Genitourinary:  [] Chronic kidney disease   [] Difficult urination  [] Frequent urination  [] Burning with urination   [] Hematuria Skin:  [] Rashes   [] Ulcers   [] Wounds Psychological:  [] History of anxiety   []  History of major depression.  Physical Examination  BP (!) 164/83   Pulse 68   Ht 5\' 2"  (1.575 m)   Wt 238 lb (108 kg)   BMI 43.53 kg/m  Gen:  WD/WN, NAD Head: [place]/AT, No temporalis wasting. Ear/Nose/Throat: Hearing grossly intact, nares w/o erythema or drainage Eyes: Conjunctiva clear. Sclera  non-icteric Neck: Supple.  Trachea midline Pulmonary:  Good air movement, no use of accessory muscles.  Cardiac: RRR, no JVD Vascular:  Vessel Right Left  Radial Palpable Palpable                          PT Palpable Palpable  DP Palpable Palpable   Gastrointestinal: soft, non-tender/non-distended. No guarding/reflex.  Musculoskeletal: M/S 5/5 throughout.  No  deformity or atrophy.  Right toes are purplish in color with delayed capillary refill.  No significant lower extremity edema. Neurologic: Sensation grossly intact in extremities.  Symmetrical.  Speech is fluent.  Psychiatric: Judgment intact, Mood & affect appropriate for pt's clinical situation. Dermatologic: No rashes or ulcers noted.  No cellulitis or open wounds.      Labs Recent Results (from the past 2160 hour(s))  Basic metabolic panel     Status: None   Collection Time: 01/14/21 11:01 AM  Result Value Ref Range   Glucose 80 65 - 99 mg/dL   BUN 13 8 - 27 mg/dL   Creatinine, Ser 0.96 0.57 - 1.00 mg/dL   eGFR 66 >59 mL/min/1.73   BUN/Creatinine Ratio 14 12 - 28   Sodium CANCELED mmol/L    Comment: Test not performed. Sample contaminated with EDTA which is not suitable for test ordered.  Result canceled by the ancillary.    Potassium CANCELED mmol/L    Comment: Test not performed. Sample contaminated with EDTA which is not suitable for test ordered.  Result canceled by the ancillary.    Chloride CANCELED mmol/L    Comment: Test not performed. Sample contaminated with EDTA which is not suitable for test ordered.  Result canceled by the ancillary.    CO2 CANCELED mmol/L    Comment: Test not performed. Sample contaminated with EDTA which is not suitable for test ordered.  Result canceled by the ancillary.    Calcium CANCELED mg/dL    Comment: Test not performed. Sample contaminated with EDTA which is not suitable for test ordered.  Result canceled by the ancillary.   Basic Metabolic Panel (BMET)      Status: Abnormal   Collection Time: 01/28/21 10:00 AM  Result Value Ref Range   Sodium 139 135 - 145 mmol/L   Potassium 4.1 3.5 - 5.1 mmol/L   Chloride 106 98 - 111 mmol/L   CO2 26 22 - 32 mmol/L   Glucose, Bld 107 (H) 70 - 99 mg/dL    Comment: Glucose reference range applies only to samples taken after fasting for at least 8 hours.   BUN 12 8 - 23 mg/dL   Creatinine, Ser 0.85 0.44 - 1.00 mg/dL   Calcium 8.7 (L) 8.9 - 10.3 mg/dL   GFR, Estimated >60 >60 mL/min    Comment: (NOTE) Calculated using the CKD-EPI Creatinine Equation (2021)    Anion gap 7 5 - 15    Comment: Performed at [facility], [address] [place], [place] [address]  ECHOCARDIOGRAM COMPLETE     Status: None   Collection Time: 02/22/21  8:10 AM  Result Value Ref Range   AR max vel 3.77 cm2   AV Peak grad 3.1 mmHg   Ao pk vel 0.88 m/s   S' Lateral 2.50 cm   Area-P 1/2 3.60 cm2   AV Area VTI 3.47 cm2   AV Mean grad 2.0 mmHg   Single Plane A4C EF 52.6 %   Single Plane A2C EF 51.2 %   Calc EF 52.9 %   AV Area mean vel 3.48 cm2  Urinalysis, Complete     Status: None   Collection Time: 03/02/21  2:35 PM  Result Value Ref Range   Specific Gravity, UA 1.015 1.005 - 1.030   pH, UA 6.0 5.0 - 7.5   Color, UA Yellow Yellow   Appearance Ur Clear Clear   Leukocytes,UA Negative Negative   Protein,UA Negative Negative/Trace   Glucose, UA Negative  Negative   Ketones, UA Negative Negative   RBC, UA Negative Negative   Bilirubin, UA Negative Negative   Urobilinogen, Ur 0.2 0.2 - 1.0 mg/dL   Nitrite, UA Negative Negative   Microscopic Examination See below:   Microscopic Examination     Status: Abnormal   Collection Time: 03/02/21  2:35 PM   Urine  Result Value Ref Range   WBC, UA None seen 0 - 5 /hpf   RBC None seen 0 - 2 /hpf   Epithelial Cells (non renal) 0-10 0 - 10 /hpf   Renal Epithel, UA 0-10 (A) None seen /hpf   Bacteria, UA None seen None seen/Few  Basic metabolic panel     Status: None    Collection Time: 03/17/21  8:21 AM  Result Value Ref Range   Sodium 140 135 - 145 mmol/L   Potassium 3.8 3.5 - 5.1 mmol/L   Chloride 105 98 - 111 mmol/L   CO2 26 22 - 32 mmol/L   Glucose, Bld 98 70 - 99 mg/dL    Comment: Glucose reference range applies only to samples taken after fasting for at least 8 hours.   BUN 17 8 - 23 mg/dL   Creatinine, Ser 0.82 0.44 - 1.00 mg/dL   Calcium 9.2 8.9 - 10.3 mg/dL   GFR, Estimated >60 >60 mL/min    Comment: (NOTE) Calculated using the CKD-EPI Creatinine Equation (2021)    Anion gap 9 5 - 15    Comment: Performed at [facility], [address] [place], [place] [address]  CBC with Differential     Status: Abnormal   Collection Time: 03/17/21  8:21 AM  Result Value Ref Range   WBC 11.3 (H) 4.0 - 10.5 K/uL   RBC 5.24 (H) 3.87 - 5.11 MIL/uL   Hemoglobin 14.5 12.0 - 15.0 g/dL   HCT 45.9 36.0 - 46.0 %   MCV 87.6 80.0 - 100.0 fL   MCH 27.7 26.0 - 34.0 pg   MCHC 31.6 30.0 - 36.0 g/dL   RDW 14.2 11.5 - 15.5 %   Platelets 347 150 - 400 K/uL   nRBC 0.0 0.0 - 0.2 %   Neutrophils Relative % 68 %   Neutro Abs 7.5 1.7 - 7.7 K/uL   Lymphocytes Relative 26 %   Lymphs Abs 3.0 0.7 - 4.0 K/uL   Monocytes Relative 6 %   Monocytes Absolute 0.7 0.1 - 1.0 K/uL   Eosinophils Relative 0 %   Eosinophils Absolute 0.1 0.0 - 0.5 K/uL   Basophils Relative 0 %   Basophils Absolute 0.0 0.0 - 0.1 K/uL   Immature Granulocytes 0 %   Abs Immature Granulocytes 0.04 0.00 - 0.07 K/uL    Comment: Performed at [facility], [address] [place], [place] [address]    Radiology ECHOCARDIOGRAM COMPLETE  Result Date: 02/22/2021    ECHOCARDIOGRAM REPORT   Patient Name:   Jade Edwards Date of Exam: 02/22/2021 Medical Rec #:  [id number]         Height:       62.0 in Accession #:    [id number]        Weight:       240.1 lb Date of Birth:  [birth year]         BSA:          2.066 m Patient Age:    9 years          BP:  142/86 mmHg Patient Gender: F                  HR:           76 bpm. Exam Location:  [place] Procedure: 2D Echo, Cardiac Doppler and Color Doppler Indications:    R06.02 SOB  History:        Patient has no prior history of Echocardiogram examinations.                 Mitral Valve Disease, Signs/Symptoms:Shortness of Breath; Risk                 Factors:Non-Smoker and s/p Covid.  Sonographer:    Pilar Jarvis RDMS, RVT, RDCS Referring Phys: [place]  1. Left ventricular ejection fraction, by estimation, is 60 to 65%. The left ventricle has normal function. The left ventricle has no regional wall motion abnormalities. Left ventricular diastolic parameters were normal.  2. Right ventricular systolic function is normal. The right ventricular size is normal. Tricuspid regurgitation signal is inadequate for assessing PA pressure.  3. The mitral valve is normal in structure. Trivial mitral valve regurgitation. No evidence of mitral stenosis.  4. The aortic valve is tricuspid. Aortic valve regurgitation is not visualized. No aortic stenosis is present.  5. The inferior vena cava is normal in size with greater than 50% respiratory variability, suggesting right atrial pressure of 3 mmHg. FINDINGS  Left Ventricle: Left ventricular ejection fraction, by estimation, is 60 to 65%. The left ventricle has normal function. The left ventricle has no regional wall motion abnormalities. The left ventricular internal cavity size was normal in size. There is  no left ventricular hypertrophy. Left ventricular diastolic parameters were normal. Right Ventricle: The right ventricular size is normal. No increase in right ventricular wall thickness. Right ventricular systolic function is normal. Tricuspid regurgitation signal is inadequate for assessing PA pressure. Left Atrium: Left atrial size was normal in size. Right Atrium: Right atrial size was normal in size. Pericardium: There is no evidence of pericardial effusion. Mitral Valve: The mitral  valve is normal in structure. Trivial mitral valve regurgitation. No evidence of mitral valve stenosis. Tricuspid Valve: The tricuspid valve is grossly normal. Tricuspid valve regurgitation is not demonstrated. Aortic Valve: The aortic valve is tricuspid. Aortic valve regurgitation is not visualized. No aortic stenosis is present. Aortic valve mean gradient measures 2.0 mmHg. Aortic valve peak gradient measures 3.1 mmHg. Aortic valve area, by VTI measures 3.47 cm. Pulmonic Valve: The pulmonic valve was normal in structure. Pulmonic valve regurgitation is not visualized. No evidence of pulmonic stenosis. Aorta: The aortic root and ascending aorta are structurally normal, with no evidence of dilitation. Pulmonary Artery: The pulmonary artery is of normal size. Venous: The inferior vena cava is normal in size with greater than 50% respiratory variability, suggesting right atrial pressure of 3 mmHg. IAS/Shunts: No atrial level shunt detected by color flow Doppler.  LEFT VENTRICLE PLAX 2D LVIDd:         3.80 cm     Diastology LVIDs:         2.50 cm     LV e' medial:    8.59 cm/s LV PW:         0.70 cm     LV E/e' medial:  11.8 LV IVS:        0.90 cm     LV e' lateral:   11.40 cm/s LVOT diam:     2.00 cm  LV E/e' lateral: 8.9 LV SV:         70 LV SV Index:   34 LVOT Area:     3.14 cm  LV Volumes (MOD) LV vol d, MOD A2C: 83.8 ml LV vol d, MOD A4C: 71.7 ml LV vol s, MOD A2C: 40.9 ml LV vol s, MOD A4C: 34.0 ml LV SV MOD A2C:     42.9 ml LV SV MOD A4C:     71.7 ml LV SV MOD BP:      41.8 ml RIGHT VENTRICLE             IVC RV Basal diam:  2.80 cm     IVC diam: 1.60 cm RV S prime:     17.10 cm/s TAPSE (M-mode): 3.2 cm LEFT ATRIUM             Index       RIGHT ATRIUM           Index LA diam:        3.40 cm 1.65 cm/m  RA Area:     11.10 cm LA Vol (A2C):   37.2 ml 18.00 ml/m RA Volume:   22.20 ml  10.74 ml/m LA Vol (A4C):   31.9 ml 15.44 ml/m LA Biplane Vol: 34.9 ml 16.89 ml/m  AORTIC VALVE                   PULMONIC  VALVE AV Area (Vmax):    3.77 cm    PV Vmax:       0.90 m/s AV Area (Vmean):   3.48 cm    PV Peak grad:  3.2 mmHg AV Area (VTI):     3.47 cm AV Vmax:           87.50 cm/s AV Vmean:          63.300 cm/s AV VTI:            0.203 m AV Peak Grad:      3.1 mmHg AV Mean Grad:      2.0 mmHg LVOT Vmax:         105.00 cm/s LVOT Vmean:        70.100 cm/s LVOT VTI:          0.224 m LVOT/AV VTI ratio: 1.10  AORTA Ao Root diam: 2.80 cm Ao Asc diam:  2.80 cm Ao Arch diam: 2.7 cm MITRAL VALVE MV Area (PHT): 3.60 cm     SHUNTS MV Decel Time: 211 msec     Systemic VTI:  0.22 m MV E velocity: 101.00 cm/s  Systemic Diam: 2.00 cm MV A velocity: 110.00 cm/s MV E/A ratio:  0.92 Harrell Gave End MD Electronically signed by Nelva Bush MD Signature Date/Time: 02/22/2021/5:20:54 PM    Final     Assessment/Plan Raynaud's disease without gangrene Symptoms stable and long standing.  Contributes to the discoloration in the toes.   Varicose veins of right lower extremity with pain Has some venous stasis changes and prominent varicosities on the right leg.  Elevated and compression stockings as needed.  Lower extremity arterial insufficiency, severe, right (HCC) ABIs today are normal at 1.07 on the right and the left although the digital waveforms are significantly reduced on the right.  She remains on anticoagulation I discussed that this should be continued for a year if not indefinitely.  Another option would be at 1 year going to prophylactic dose of anticoagulation would also be reasonable.  I will plan to see  her back in about 6 months with noninvasive studies or sooner if problems develop in the interim    Leotis Pain, MD  03/18/2021 9:27 AM    This note was created with Dragon medical transcription system.  Any errors from dictation are purely unintentional

## 2021-03-18 NOTE — Assessment & Plan Note (Signed)
ABIs today are normal at 1.07 on the right and the left although the digital waveforms are significantly reduced on the right.  She remains on anticoagulation I discussed that this should be continued for a year if not indefinitely.  Another option would be at 1 year going to prophylactic dose of anticoagulation would also be reasonable.  I will plan to see her back in about 6 months with noninvasive studies or sooner if problems develop in the interim

## 2021-04-04 ENCOUNTER — Encounter: Payer: Self-pay | Admitting: *Deleted

## 2021-04-04 NOTE — Telephone Encounter (Signed)
Left Vm to get Renal ultrasound completed

## 2021-04-05 NOTE — Progress Notes (Incomplete)
   04/05/21  CC: No chief complaint on file.    HPI: Jade Edwards is a 65 y.o.female with history of gross hematuria who presents today for a cystoscopy and RUS results.   She developed 2 episodes of painless gross hematuria, once on May 12 which lasted approximately 5 days and then again on June 13 which lasted 3 days.  She had no other associated urinary symptoms occluding no dysuria, gross hematuria, or any other signs or symptoms of infection.  She had her urine evaluated and no infection was identified.  She has a person history of gout and has had several recent flares.   RUS showed **      There were no vitals filed for this visit. NED. A&Ox3.   No respiratory distress   Abd soft, NT, ND Normal external genitalia with patent urethral meatus  Cystoscopy Procedure Note  Patient identification was confirmed, informed consent was obtained, and patient was prepped using Betadine solution.  Lidocaine jelly was administered per urethral meatus.    Procedure: - Flexible cystoscope introduced, without any difficulty.   - Thorough search of the bladder revealed:    normal urethral meatus    normal urothelium    no stones    no ulcers     no tumors    no urethral polyps    no trabeculation  - Ureteral orifices were normal in position and appearance.  Post-Procedure: - Patient tolerated the procedure well  Assessment/ Plan: Gross hematuria    No follow-ups on file.  I,Kailey Littlejohn,acting as a Neurosurgeon for Vanna Scotland, MD.,have documented all relevant documentation on the behalf of Vanna Scotland, MD,as directed by  Vanna Scotland, MD while in the presence of Vanna Scotland, MD.

## 2021-04-06 ENCOUNTER — Other Ambulatory Visit: Payer: Self-pay | Admitting: Urology

## 2021-05-16 ENCOUNTER — Other Ambulatory Visit: Payer: Self-pay

## 2021-05-16 ENCOUNTER — Ambulatory Visit
Admission: RE | Admit: 2021-05-16 | Discharge: 2021-05-16 | Disposition: A | Discharge status: Home / Self Care | Payer: BC Managed Care – PPO | Source: Ambulatory Visit | Attending: Urology | Admitting: Urology

## 2021-05-16 DIAGNOSIS — R310 Gross hematuria: Secondary | ICD-10-CM

## 2021-05-17 NOTE — Progress Notes (Signed)
   05/18/21  CC:  Chief Complaint  Patient presents with   Cysto     HPI: Jade Edwards is a 65 y.o.female with a personal history of painless gross hematuria, who presents today for RUS results and a cystoscopy.   In April 2022 she developed acute pulmonary embolism/ deep vein thrombosis of unclear etiology. She has undergone extensive evaluation for this including cardiac and hematologic without any clear definitive cause. She has since been on chronic anticoagulation Eliquis.   She has had two occurrences of gross hematuria this year once on May 12 that lasted approximately 5 days and then again on June 13 which lasted 3 days.  She has multiple medical comorbidities. She also has a personal history of gout with elevated uric acid levels.   She had a noncontrast CT scan of the abdomen and pelvis in May 2020 for evaluation of her gross hematuria that was negative.   05/16/2021 RUS was normal.   NED. A&Ox3.   No respiratory distress   Abd soft, NT, ND Normal external genitalia with patent urethral meatus  Cystoscopy Procedure Note  Patient identification was confirmed, informed consent was obtained, and patient was prepped using Betadine solution.  Lidocaine jelly was administered per urethral meatus.    Procedure: - Flexible cystoscope introduced, without any difficulty.   - Thorough search of the bladder revealed:    normal urethral meatus    normal urothelium    no stones    no ulcers     no tumors    no urethral polyps    no trabeculation  - Ureteral orifices were normal in position and appearance.  Mild trigonitis appreciated.  Post-Procedure: - Patient tolerated the procedure well  Assessment/ Plan:  Gross Hematuria  - Cystoscopy unremarkable today  - She should return if she sees blood in her urine  - Advised for her to check urine with PCP routinely to monitor if she has any blood in her urine.    Return if symptoms worsen or fail to  improve.  I,Kailey Littlejohn,acting as a Neurosurgeon for Vanna Scotland, MD.,have documented all relevant documentation on the behalf of Vanna Scotland, MD,as directed by  Vanna Scotland, MD while in the presence of Vanna Scotland, MD.  I have reviewed the above documentation for accuracy and completeness, and I agree with the above.   Vanna Scotland, MD

## 2021-05-18 ENCOUNTER — Ambulatory Visit (INDEPENDENT_AMBULATORY_CARE_PROVIDER_SITE_OTHER): Payer: BC Managed Care – PPO | Admitting: Urology

## 2021-05-18 ENCOUNTER — Other Ambulatory Visit: Payer: Self-pay

## 2021-05-18 DIAGNOSIS — R310 Gross hematuria: Secondary | ICD-10-CM | POA: Diagnosis not present

## 2021-05-19 LAB — MICROSCOPIC EXAMINATION

## 2021-05-19 LAB — URINALYSIS, COMPLETE
Bilirubin, UA: NEGATIVE
Glucose, UA: NEGATIVE
Ketones, UA: NEGATIVE
Nitrite, UA: NEGATIVE
Protein,UA: NEGATIVE
RBC, UA: NEGATIVE
Specific Gravity, UA: 1.025 (ref 1.005–1.030)
Urobilinogen, Ur: 0.2 mg/dL (ref 0.2–1.0)
pH, UA: 5.5 (ref 5.0–7.5)

## 2021-05-23 ENCOUNTER — Ambulatory Visit: Payer: BC Managed Care – PPO

## 2021-08-16 ENCOUNTER — Other Ambulatory Visit: Payer: Self-pay

## 2021-08-16 ENCOUNTER — Ambulatory Visit
Admission: RE | Admit: 2021-08-16 | Discharge: 2021-08-16 | Disposition: A | Discharge status: Home / Self Care | Payer: BC Managed Care – PPO | Source: Ambulatory Visit | Attending: Cardiovascular Disease | Admitting: Cardiovascular Disease

## 2021-08-16 ENCOUNTER — Other Ambulatory Visit: Payer: Self-pay | Admitting: Cardiovascular Disease

## 2021-08-16 DIAGNOSIS — I749 Embolism and thrombosis of unspecified artery: Secondary | ICD-10-CM

## 2021-08-16 MED ORDER — IOHEXOL 350 MG/ML SOLN
100.0000 mL | Freq: Once | INTRAVENOUS | Status: AC | PRN
  Administered 2021-08-16: 12:00:00 100 mL via INTRAVENOUS

## 2021-09-15 ENCOUNTER — Ambulatory Visit: Payer: BC Managed Care – PPO | Admitting: Cardiovascular Disease

## 2021-09-16 ENCOUNTER — Encounter (INDEPENDENT_AMBULATORY_CARE_PROVIDER_SITE_OTHER): Payer: BC Managed Care – PPO

## 2021-09-16 ENCOUNTER — Ambulatory Visit (INDEPENDENT_AMBULATORY_CARE_PROVIDER_SITE_OTHER): Payer: BC Managed Care – PPO | Admitting: Vascular Surgery

## 2021-10-11 ENCOUNTER — Ambulatory Visit: Payer: No Typology Code available for payment source | Admitting: Medical

## 2021-10-11 ENCOUNTER — Encounter: Payer: Self-pay | Admitting: Medical

## 2021-10-11 ENCOUNTER — Other Ambulatory Visit: Payer: Self-pay

## 2021-10-11 VITALS — BP 160/82 | HR 71 | Ht 63.0 in | Wt 245.0 lb

## 2021-10-11 DIAGNOSIS — I480 Paroxysmal atrial fibrillation: Secondary | ICD-10-CM | POA: Diagnosis not present

## 2021-10-11 DIAGNOSIS — G4730 Sleep apnea, unspecified: Secondary | ICD-10-CM | POA: Diagnosis not present

## 2021-10-11 DIAGNOSIS — I749 Embolism and thrombosis of unspecified artery: Secondary | ICD-10-CM

## 2021-10-11 DIAGNOSIS — I471 Supraventricular tachycardia: Secondary | ICD-10-CM | POA: Diagnosis not present

## 2021-10-11 DIAGNOSIS — G479 Sleep disorder, unspecified: Secondary | ICD-10-CM

## 2021-10-11 MED ORDER — HYDROCHLOROTHIAZIDE 12.5 MG PO CAPS: 12.5000 mg | ORAL_CAPSULE | Freq: Every day | ORAL | 3 refills | Status: DC

## 2021-10-11 MED ORDER — METOPROLOL TARTRATE 25 MG PO TABS: 25.0000 mg | ORAL_TABLET | Freq: Two times a day (BID) | ORAL | 3 refills | Status: DC

## 2021-10-11 MED ORDER — APIXABAN 5 MG PO TABS: 5.0000 mg | ORAL_TABLET | Freq: Two times a day (BID) | ORAL | 0 refills | Status: AC

## 2021-10-11 NOTE — Patient Instructions (Signed)
Medication Instructions:  Your physician has recommended you make the following change in your medication:   START taking metoprolol tartrate (Lopressor) 25 mg two times daily   START taking hydrochlorothiazide (Microzide) 12.5 mg daily   *If you need a refill on your cardiac medications before your next appointment, please call your pharmacy*   Lab Work:  Your physician recommends that you return for lab work (BMET) in: 1 week    Please return to our office on_____________________at______________am/pm   If you have labs (blood work) drawn today and your tests are completely normal, you will receive your results only by: MyChart Message (if you have MyChart) OR A paper copy in the mail If you have any lab test that is abnormal or we need to change your treatment, we will call you to review the results.   Testing/Procedures: None ordered   Follow-Up: At [facility], you and your health needs are our priority.  As part of our continuing mission to provide you with exceptional heart care, we have created designated Provider Care Teams.  These Care Teams include your primary Cardiologist (physician) and Advanced Practice Providers (APPs -  Physician Assistants and Nurse Practitioners) who all work together to provide you with the care you need, when you need it.  We recommend signing up for the patient portal called "MyChart".  Sign up information is provided on this After Visit Summary.  MyChart is used to connect with patients for Virtual Visits (Telemedicine).  Patients are able to view lab/test results, encounter notes, upcoming appointments, etc.  Non-urgent messages can be sent to your provider as well.   To learn more about what you can do with MyChart, go to [email or web address].    Your next appointment:   4 month(s)  The format for your next appointment:   In Person  Provider:   Lorine Bears, MD    Other Instructions A referral has been sent to pulmonology  for a sleep study

## 2021-10-11 NOTE — Progress Notes (Signed)
Cardiology Office Note:    Date:  10/11/2021   ID:  Jade Edwards, DOB 19-Nov-1955, MRN [id number]  PCP:  Sharyne Peach, MD  [facility] Cardiologist:  Kathlyn Sacramento, MD  [facility] Electrophysiologist:  None   Referring MD: Sharyne Peach, MD   Chief Complaint: 6 month follow-up   History of Present Illness:    Jade Edwards is a 66 y.o. 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   In April 2022, she was admitted with acute right foot pain secondary to acute limb ischemia.  Angiogram showed abrupt occlusion of the right popliteal artery as well as occlusion of all 3 tibial vessels in the proximal segment.  She underwent thrombectomy and balloon angioplasty.  She was subsequently placed on Eliquis.  Event monitoring in June 2022 showed 53 episodes of SVT, the fastest 197 bpm x 13.1 seconds.  Some SVT noted was felt to potentially represent atrial tachycardia versus atrial fibrillation.  In the setting of prior embolism, recommendations made for continued oral anticoagulation.  Echocardiogram earlier this month showed normal LV function (60-65%), with trivial MR.  Last seen 02/24/21 and was dong well. Intermittent hematuria on Eliquis. She was referred for sleep study. Metoprolol was started.   Today, patient reports she needs a refill on Eliquis. She did not start metoprolol becasue bp at home is normal. BP at home 130-140/70. She has a lot of inflammatory issues. She has gained about 50lbs in the last 4 years due to inactivity from joint pain/inflammatory issues. She does fluid on her legs from time to time, she says steroids improve the swelling. Minimal lower leg swelling on exam today. Says she is very inactive since the arterial embolism. No chest pain or SOB. No orthopnea or pnd. No dizziness or lightheadedness. CTA abd/pelvis reviewed, aorta  appears normal.   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 11/2020 Angio: 100% R popliteal artery and all 3 tibial vessels in the proximal segment-->s/p thrombectomy & PTA.   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 02/2021 Echo: EF 60-65%, no rwma. Nl RV fxn. Triv MR.   Obesity    Osteoarthritis of right knee    PAF (paroxysmal atrial fibrillation) ([place])    a. 01/2021 Zio: 53 runs SVT w/ ? of afib.  Eliquis started in light of h/o LE embolism.   Primary osteoarthritis of both knees    PSVT (paroxysmal supraventricular tachycardia) ([place])    a. 01/2021 Zio: 53 SVT runs. Longest/fastest 13.1 secs @ 197. Some SVT possibly AT vs Afib-->Eliquis started.   Raynaud's disease without gangrene    Rheumatoid factor positive    Stricture and stenosis of esophagus     Past Surgical History:  Procedure Laterality Date   CESAREAN SECTION     ESOPHAGOGASTRODUODENOSCOPY N/A 02/21/2020   Procedure: ESOPHAGOGASTRODUODENOSCOPY (EGD);  Surgeon: Lucilla Lame, MD;  Location: [facility] ENDOSCOPY;  Service: Endoscopy;  Laterality: N/A;   ESOPHAGOGASTRODUODENOSCOPY (EGD) WITH PROPOFOL N/A 04/20/2020  Procedure: ESOPHAGOGASTRODUODENOSCOPY (EGD) WITH PROPOFOL;  Surgeon: Lucilla Lame, MD;  Location: [facility] ENDOSCOPY;  Service: Endoscopy;  Laterality: N/A;   LOWER EXTREMITY ANGIOGRAPHY Right 11/18/2020   Procedure: Lower Extremity Angiography;  Surgeon: Algernon Huxley, MD;  Location: [place] CV LAB;  Service: Cardiovascular;  Laterality: Right;   VASCULAR SURGERY      Current Medications: Current Meds  Medication Sig   amphetamine-dextroamphetamine (ADDERALL) 20 MG tablet Take 20 mg by mouth daily.    aspirin EC 81 MG EC tablet Take 1 tablet (81 mg total) by mouth daily. Swallow whole.   atorvastatin (LIPITOR) 10 MG tablet Take 1 tablet (10 mg total) by mouth daily.   hydrochlorothiazide (MICROZIDE) 12.5 MG capsule Take 1 capsule (12.5 mg total) by mouth daily.   metoprolol tartrate (LOPRESSOR) 25 MG tablet Take 1 tablet (25 mg total) by mouth 2 (two) times daily.   [DISCONTINUED] apixaban (ELIQUIS) 5 MG TABS tablet Take 1 tablet (5 mg total) by mouth 2 (two) times daily.     Allergies:   Codeine and Epinephrine   Social History   Socioeconomic History   Marital status: Married    Spouse name: Not on file   Number of children: Not on file   Years of education: Not on file   Highest education level: Not on file  Occupational History   Not on file  Tobacco Use   Smoking status: Never   Smokeless tobacco: Never  Vaping Use   Vaping Use: Never used  Substance and Sexual Activity   Alcohol use: No   Drug use: No   Sexual activity: Not on file  Other Topics Concern   Not on file  Social History Narrative   No smoking; no alcohol. 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 Lives [place].    Social Determinants of Health   Financial Resource Strain: Not on file  Food Insecurity: Not on file  Transportation Needs: Not on file  Physical Activity: Not on file  Stress: Not on file  Social Connections: Not on file     Family History: The patient's family history includes Bipolar disorder in her mother; Breast cancer in her mother; Coronary artery disease in her father; Depression in her mother; Heart attack (age of onset: 27) in her father; Hyperlipidemia in her mother; Lupus in her mother; Mental illness in her mother; Osteoarthritis in her daughter and son; Other in her mother and sister; Rheum arthritis in her mother; Stroke in her mother. There is no history of Bladder Cancer, Prostate cancer, or Kidney cancer.  ROS:   Please see the history of  present illness.     All other systems reviewed and are negative.  EKGs/Labs/Other Studies Reviewed:    The following studies were reviewed today:  Echo 02/2021 1. Left ventricular ejection fraction, by estimation, is 60 to 65%. The  left ventricle has normal function. The left ventricle has no regional  wall motion abnormalities. Left ventricular diastolic parameters were  normal.   2. Right ventricular systolic function is normal. The right ventricular  size is normal. Tricuspid regurgitation signal is inadequate for assessing  PA pressure.   3. The mitral valve is normal in structure. Trivial mitral valve  regurgitation. No evidence of mitral stenosis.   4. The aortic valve is tricuspid. Aortic valve regurgitation is not  visualized. No aortic stenosis is present.   5. The inferior vena cava is normal in size with greater than 50%  respiratory  variability, suggesting right atrial pressure of 3 mmHg.   Long-term monitor 2022 Patch Wear Time:  14 days and 0 hours (2022-05-27T10:53:47-0400 to 2022-06-10T10:53:51-0400)   Patient had a min HR of 55 bpm, max HR of 197 bpm, and avg HR of 79 bpm.  Predominant underlying rhythm was Sinus Rhythm. 1 run of borderline wide-complex tachycardia occurred lasting 9 beats with a max rate of 129 bpm (avg 105 bpm).  Suspect that this is SVT with aberrancy and less likely ventricular tachycardia. 53 Supraventricular Tachycardia runs occurred, the run with the fastest interval lasting 13.1 secs with a max rate of 197 bpm (avg 156 bpm); the run with the fastest interval was also the longest. Some episodes of Supraventricular Tachycardia may be possible Atrial Tachycardia with variable block or brief atrial fibrillation. Occasional PACs and rare PVCs.   EKG:  EKG is  ordered today.  The ekg ordered today demonstrates NSR, 71bpm, nonspecific T wave changes, LAD, no changes  Recent Labs: 12/15/2020: ALT 25 03/17/2021: BUN 17; Creatinine, Ser 0.82;  Hemoglobin 14.5; Platelets 347; Potassium 3.8; Sodium 140  Recent Lipid Panel    Component Value Date/Time   CHOL 209 (H) 11/18/2012 1016   TRIG 172.0 (H) 11/18/2012 1016   HDL 48.30 11/18/2012 1016   CHOLHDL 4 11/18/2012 1016   VLDL 34.4 11/18/2012 1016   LDLCALC 115 (H) 11/22/2011 0822   LDLDIRECT 139.9 11/18/2012 1016    Physical Exam:    VS:  BP (!) 160/82 (BP Location: Left Arm, Patient Position: Sitting, Cuff Size: Large)    Pulse 71    Ht 5\' 3"  (1.6 m)    Wt 245 lb (111.1 kg)    SpO2 98%    BMI 43.40 kg/m     Wt Readings from Last 3 Encounters:  10/11/21 245 lb (111.1 kg)  03/18/21 238 lb (108 kg)  03/17/21 241 lb (109.3 kg)     GEN:  Well nourished, well developed in no acute distress HEENT: Normal NECK: No JVD; No carotid bruits LYMPHATICS: No lymphadenopathy CARDIAC: RRR, no murmurs, rubs, gallops RESPIRATORY:  Clear to auscultation without rales, wheezing or rhonchi  ABDOMEN: Soft, non-tender, non-distended MUSCULOSKELETAL:  No edema; No deformity  SKIN: Warm and dry NEUROLOGIC:  Alert and oriented x 3 PSYCHIATRIC:  Normal affect   ASSESSMENT:    1. PAF (paroxysmal atrial fibrillation) ([place])   2. Sleep disorder breathing   3. PSVT (paroxysmal supraventricular tachycardia) ([place])   4. Arterial embolism ([place])   5. Sleep disorder    PLAN:    In order of problems listed above:  PAF She has h/o arterial thrombus in the right lower extremity. Subsuqent heart monitor showed pSVT with possible afib. CHASVASC at least 3 (PAD, female, HTN). Started on Eliquis for thrombus and afib.Refill Eliquis today. Based off heart monitor recommendations were made for continuation of long-term oral anticoagulation. Re-start metoprolol for rate control and BP. EKG today shows SR.   Right lower extremity arterial thrombus S/p thrombectomy and balloon angioplasty. Continue Eliquis therapy. CTA chest showed no thrombus in the aorta. She sees [facility] next week.   Sleep disorder   Unsure if she complete the watchpat, unable to locate results in the chart. I will refer to pulmonology for sleep study.  HLD LDL 93 12/2020. Continue Lipitor 10mg  daily.   HTN BP 160/82, at home reports 130-140/70s. Reports it has been higher given 50lbs weight gain over the last few years. I recommend she start metoprolol 25mg  BID. Will also start  HCTZ 12.5mg  daily for BP and lower leg swelling. BMET in a week.    Disposition: Follow up in 4 month(s) with MD     Signed, Shyloh Derosa Ninfa Meeker, PA-C  10/11/2021 12:42 PM    Carl

## 2021-10-18 ENCOUNTER — Other Ambulatory Visit: Payer: Self-pay

## 2021-10-18 ENCOUNTER — Ambulatory Visit (INDEPENDENT_AMBULATORY_CARE_PROVIDER_SITE_OTHER): Payer: No Typology Code available for payment source

## 2021-10-18 ENCOUNTER — Ambulatory Visit (INDEPENDENT_AMBULATORY_CARE_PROVIDER_SITE_OTHER): Payer: Self-pay | Admitting: Vascular Surgery

## 2021-10-18 ENCOUNTER — Other Ambulatory Visit (INDEPENDENT_AMBULATORY_CARE_PROVIDER_SITE_OTHER): Payer: No Typology Code available for payment source

## 2021-10-18 ENCOUNTER — Encounter (INDEPENDENT_AMBULATORY_CARE_PROVIDER_SITE_OTHER): Payer: Self-pay | Admitting: Vascular Surgery

## 2021-10-18 VITALS — BP 146/86 | HR 76 | Resp 17 | Ht 63.0 in | Wt 242.0 lb

## 2021-10-18 DIAGNOSIS — I83811 Varicose veins of right lower extremities with pain: Secondary | ICD-10-CM

## 2021-10-18 DIAGNOSIS — I739 Peripheral vascular disease, unspecified: Secondary | ICD-10-CM

## 2021-10-18 DIAGNOSIS — I480 Paroxysmal atrial fibrillation: Secondary | ICD-10-CM | POA: Diagnosis not present

## 2021-10-18 DIAGNOSIS — I7300 Raynaud's syndrome without gangrene: Secondary | ICD-10-CM

## 2021-10-18 NOTE — Progress Notes (Signed)
MRN : [id number]  Jade Edwards is a 66 y.o. (1956-05-03) female who presents with chief complaint of  Chief Complaint  Patient presents with   Follow-up    ultrasound  .  History of Present Illness: Patient returns today in follow up of her ischemia of the right lower extremity from a thromboembolic event almost a year ago.  She is doing well.  She continues on anticoagulation which she is tolerating well.  She will need to stop that for some upcoming dental work for approximately 48 to 72 hours which I think is reasonable.  She has stable Raynaud's disease which is unchanged.  No new ulceration or infection.  No rest pain.  She apparently has some autoimmune issues that are being worked out at this time as well. ABIs today are 1.02 on the right and 1.06 on the left with multiphasic waveforms bilaterally.   Current Outpatient Medications  Medication Sig Dispense Refill   amphetamine-dextroamphetamine (ADDERALL) 20 MG tablet Take 20 mg by mouth daily.     apixaban (ELIQUIS) 5 MG TABS tablet Take 1 tablet (5 mg total) by mouth 2 (two) times daily. 60 tablet 0   aspirin EC 81 MG EC tablet Take 1 tablet (81 mg total) by mouth daily. Swallow whole.     atorvastatin (LIPITOR) 10 MG tablet Take 1 tablet (10 mg total) by mouth daily. 30 tablet 2   hydrochlorothiazide (MICROZIDE) 12.5 MG capsule Take 1 capsule (12.5 mg total) by mouth daily. 90 capsule 3   metoprolol tartrate (LOPRESSOR) 25 MG tablet Take 1 tablet (25 mg total) by mouth 2 (two) times daily. 180 tablet 3   No current facility-administered medications for this visit.    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 11/2020 Angio: 100% R popliteal artery and all 3 tibial vessels in the proximal segment-->s/p thrombectomy & PTA.   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 02/2021 Echo: EF 60-65%, no rwma. Nl RV fxn. Triv MR.   Obesity    Osteoarthritis of right knee    PAF (paroxysmal atrial fibrillation) ([place])    a. 01/2021 Zio: 53 runs SVT w/ ? of afib.  Eliquis started in light of h/o LE embolism.   Primary osteoarthritis of both knees    PSVT (paroxysmal supraventricular tachycardia) ([place])    a. 01/2021 Zio: 53 SVT runs. Longest/fastest 13.1 secs @ 197. Some SVT possibly AT vs Afib-->Eliquis started.   Raynaud's disease without gangrene    Rheumatoid factor positive    Stricture and stenosis of esophagus     Past Surgical History:  Procedure Laterality Date   CESAREAN SECTION     ESOPHAGOGASTRODUODENOSCOPY N/A 02/21/2020   Procedure: ESOPHAGOGASTRODUODENOSCOPY (EGD);  Surgeon: Lucilla Lame, MD;  Location: [facility] ENDOSCOPY;  Service: Endoscopy;  Laterality: N/A;   ESOPHAGOGASTRODUODENOSCOPY (EGD) WITH PROPOFOL N/A 04/20/2020   Procedure: ESOPHAGOGASTRODUODENOSCOPY (EGD) WITH PROPOFOL;  Surgeon: Lucilla Lame, MD;  Location: [facility] ENDOSCOPY;  Service: Endoscopy;  Laterality: N/A;   LOWER EXTREMITY ANGIOGRAPHY Right 11/18/2020   Procedure: Lower Extremity Angiography;  Surgeon: Algernon Huxley, MD;  Location: [facility]  INVASIVE CV LAB;  Service: Cardiovascular;  Laterality: Right;   VASCULAR SURGERY       Social History   Tobacco Use   Smoking status: Never   Smokeless tobacco: Never  Vaping Use   Vaping Use: Never used  Substance Use Topics   Alcohol use: No   Drug use: No      Family History  Problem Relation Age of Onset   Mental illness Mother    Bipolar disorder Mother    Breast cancer Mother    Depression Mother    Other Mother        DVTs   Lupus Mother    Hyperlipidemia Mother    Rheum arthritis Mother    Stroke Mother    Coronary artery disease Father    Heart attack Father 97    Other Sister        blood clots   Osteoarthritis Daughter    Osteoarthritis Son    Bladder Cancer Neg Hx    Prostate cancer Neg Hx    Kidney cancer Neg Hx      Allergies  Allergen Reactions   Codeine Nausea Only   Epinephrine Palpitations     REVIEW OF SYSTEMS (Negative unless checked)  Constitutional: [] Weight loss  [] Fever  [] Chills Cardiac: [] Chest pain   [] Chest pressure   [] Palpitations   [] Shortness of breath when laying flat   [] Shortness of breath at rest   [] Shortness of breath with exertion. Vascular:  [] Pain in legs with walking   [] Pain in legs at rest   [] Pain in legs when laying flat   [] Claudication   [] Pain in feet when walking  [] Pain in feet at rest  [] Pain in feet when laying flat   [] History of DVT   [] Phlebitis   [] Swelling in legs   [] Varicose veins   [] Non-healing ulcers Pulmonary:   [] Uses home oxygen   [] Productive cough   [] Hemoptysis   [] Wheeze  [] COPD   [] Asthma Neurologic:  [] Dizziness  [] Blackouts   [] Seizures   [] History of stroke   [] History of TIA  [] Aphasia   [] Temporary blindness   [] Dysphagia   [] Weakness or numbness in arms   [] Weakness or numbness in legs Musculoskeletal:  [x] Arthritis   [] Joint swelling   [x] Joint pain   [] Low back pain Hematologic:  [x] Easy bruising  [] Easy bleeding   [] Hypercoagulable state   [] Anemic   Gastrointestinal:  [] Blood in stool   [] Vomiting blood  [] Gastroesophageal reflux/heartburn   [] Abdominal pain Genitourinary:  [] Chronic kidney disease   [] Difficult urination  [] Frequent urination  [] Burning with urination   [] Hematuria Skin:  [] Rashes   [] Ulcers   [] Wounds Psychological:  [] History of anxiety   []  History of major depression.  Physical Examination  BP (!) 146/86 (BP Location: Left Arm)    Pulse 76    Resp 17    Ht 5\' 3"  (1.6 m)    Wt 242 lb (109.8 kg)    BMI 42.87 kg/m  Gen:  WD/WN, NAD. Obese.  Head: [place]/AT, No temporalis wasting. Ear/Nose/Throat: Hearing grossly intact, nares w/o erythema or  drainage Eyes: Conjunctiva clear. Sclera non-icteric Neck: Supple.  Trachea midline Pulmonary:  Good air movement, no use of accessory muscles.  Cardiac: RRR, no JVD Vascular:  Vessel Right Left  Radial Palpable Palpable                          PT Palpable Palpable  DP Palpable Palpable  Musculoskeletal: M/S 5/5 throughout.  No deformity or atrophy. Diffuse varicosities. Moderate purplish discoloration of the right toes and foot and mild on the left.  Trace bilateral lower extremity edema. Neurologic: Sensation grossly intact in extremities.  Symmetrical.  Speech is fluent.  Psychiatric: Judgment intact, Mood & affect appropriate for pt's clinical situation. Dermatologic: No rashes or ulcers noted.  No cellulitis or open wounds.      Labs No results found for this or any previous visit (from the past 2160 hour(s)).  Radiology No results found.  Assessment/Plan Raynaud's disease without gangrene Symptoms stable and long standing.  Contributes to the discoloration in the toes.   Varicose veins of right lower extremity with pain Has some venous stasis changes and prominent varicosities on the right leg.  Elevated and compression stockings as needed.  Lower extremity arterial insufficiency, severe, right (HCC) ABIs today are 1.02 on the right and 1.06 on the left with multiphasic waveforms bilaterally.  Doing well clinically.  No complaints.  She is going to remain on anticoagulation and follows with multiple other physicians as well.  Return in 6 months with ABIs.    Leotis Pain, MD  10/18/2021 10:19 AM    This note was created with Dragon medical transcription system.  Any errors from dictation are purely unintentional

## 2021-10-18 NOTE — Assessment & Plan Note (Signed)
ABIs today are 1.02 on the right and 1.06 on the left with multiphasic waveforms bilaterally.  Doing well clinically.  No complaints.  She is going to remain on anticoagulation and follows with multiple other physicians as well.  Return in 6 months with ABIs.

## 2021-10-19 LAB — BASIC METABOLIC PANEL
BUN/Creatinine Ratio: 19 (ref 12–28)
BUN: 17 mg/dL (ref 8–27)
CO2: 18 mmol/L — ABNORMAL LOW (ref 20–29)
Calcium: 9.2 mg/dL (ref 8.7–10.3)
Chloride: 100 mmol/L (ref 96–106)
Creatinine, Ser: 0.9 mg/dL (ref 0.57–1.00)
Glucose: 104 mg/dL — ABNORMAL HIGH (ref 70–99)
Potassium: 4.3 mmol/L (ref 3.5–5.2)
Sodium: 139 mmol/L (ref 134–144)
eGFR: 72 mL/min/{1.73_m2} (ref >59)

## 2021-11-07 ENCOUNTER — Institutional Professional Consult (permissible substitution): Payer: No Typology Code available for payment source | Admitting: Adult Health

## 2022-01-05 ENCOUNTER — Other Ambulatory Visit: Payer: Self-pay | Admitting: Family Medicine

## 2022-01-05 ENCOUNTER — Other Ambulatory Visit (HOSPITAL_COMMUNITY): Payer: Self-pay | Admitting: Family Medicine

## 2022-01-05 DIAGNOSIS — K760 Fatty (change of) liver, not elsewhere classified: Secondary | ICD-10-CM

## 2022-02-23 ENCOUNTER — Ambulatory Visit: Payer: No Typology Code available for payment source | Admitting: Cardiovascular Disease

## 2022-03-31 IMAGING — US US RENAL
1 series · 14 of 25 positions shown · non-contrast
Comparison: CT 01/07/2021

CLINICAL DATA: Hematuria

EXAM:
RENAL / URINARY TRACT ULTRASOUND COMPLETE

[Series 1: us renal · 0.30mm/px · 14 of 41 slices shown]
[im 1/41]
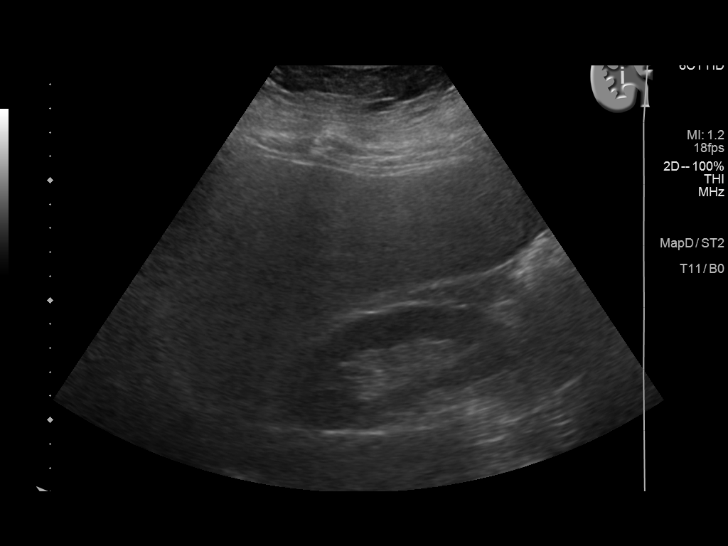
[im 4/41]
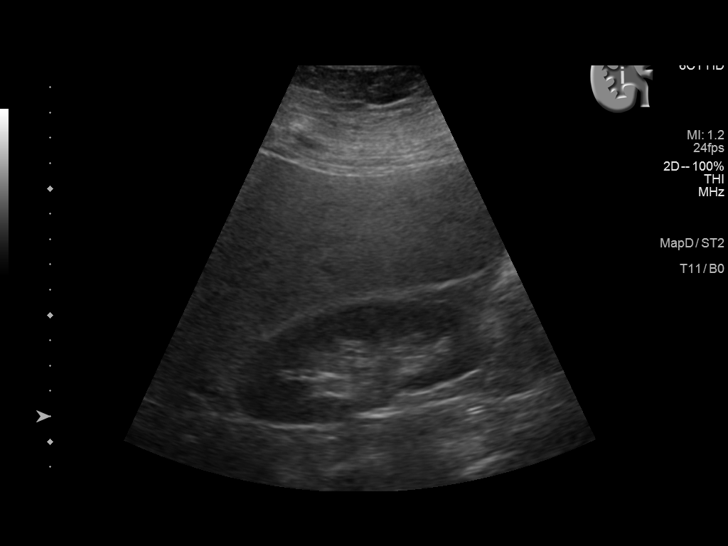
[im 7/41]
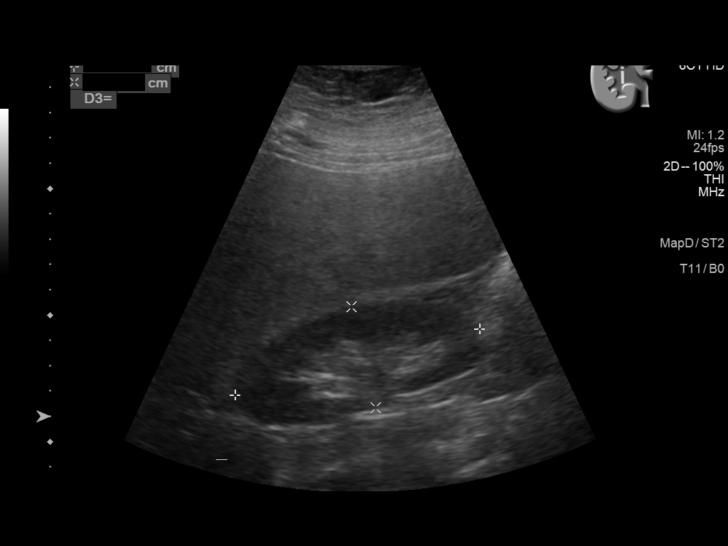
[im 11/41]
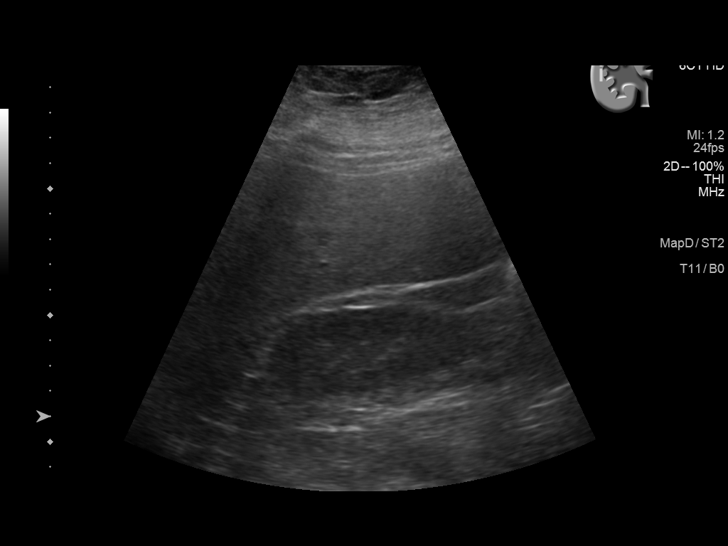
[im 14/41]
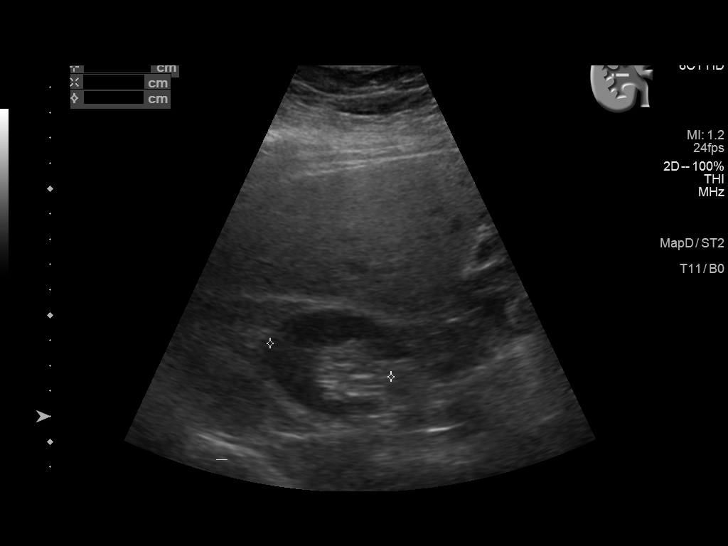
[im 16/41]
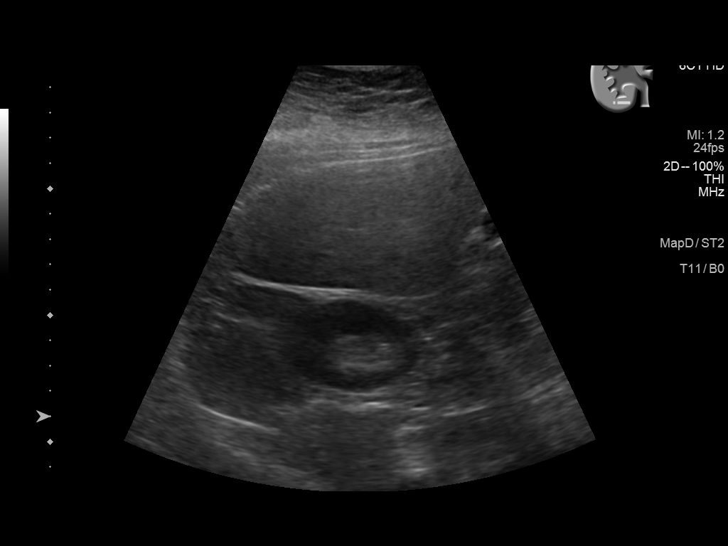
[im 19/41]
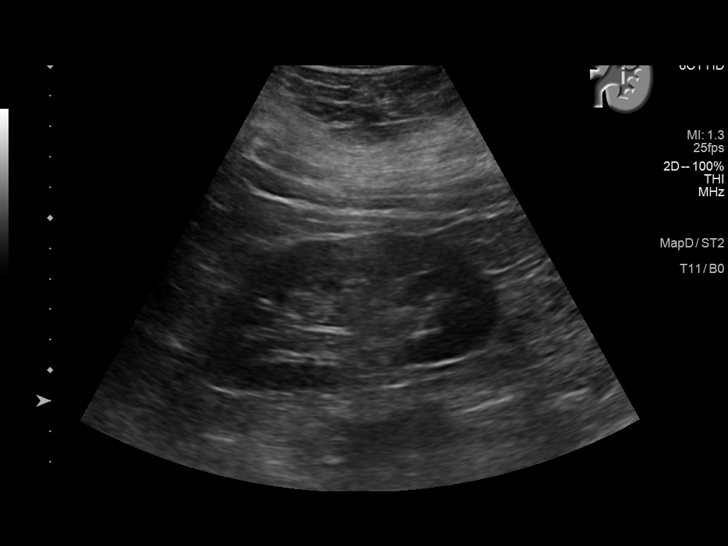
[im 22/41]
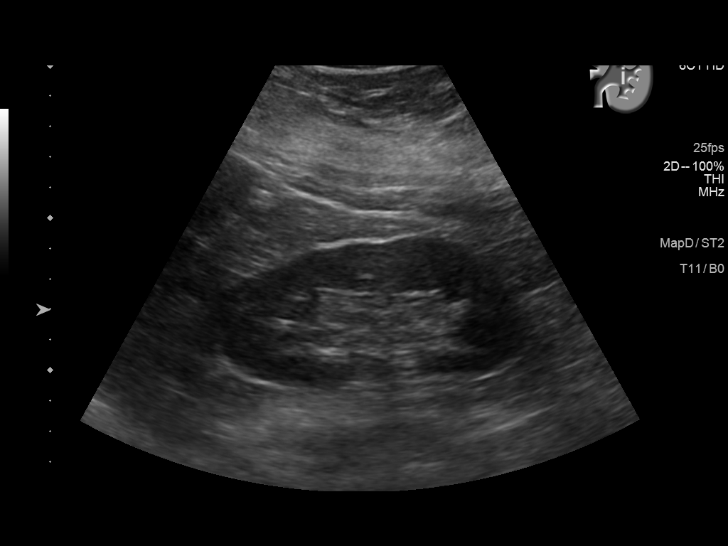
[im 26/41]
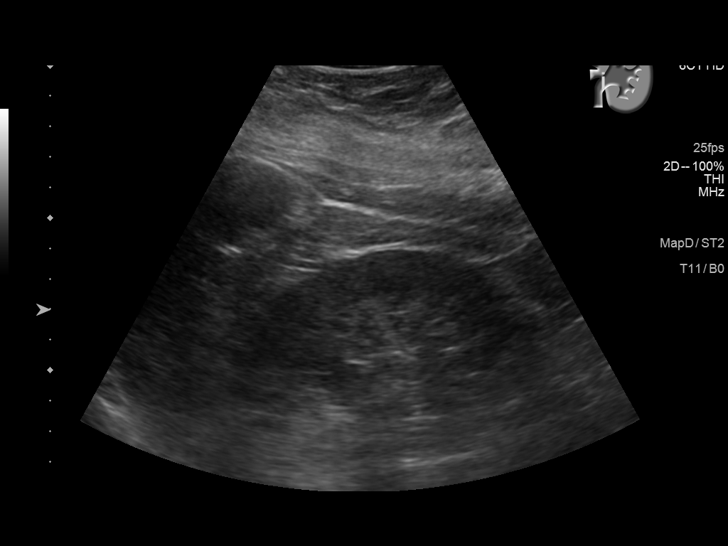
[im 27/41]
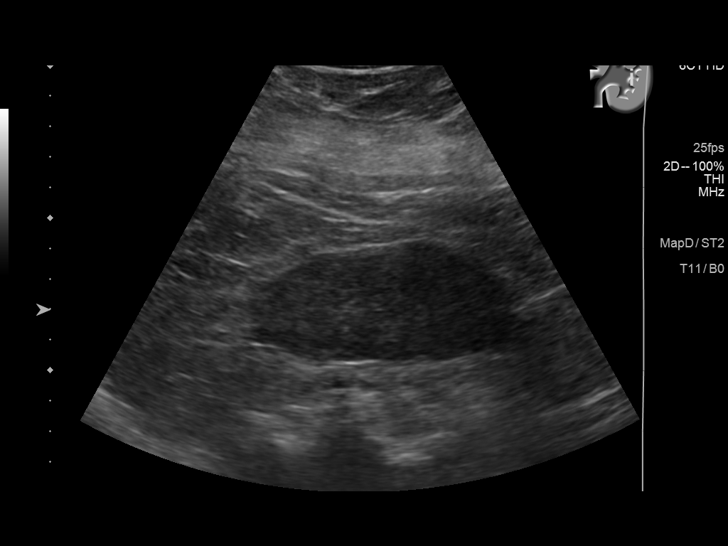
[im 31/41]
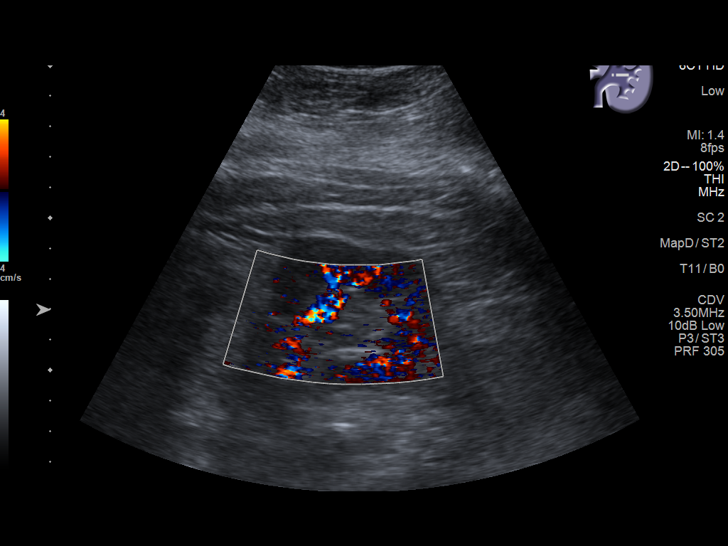
[im 34/41]
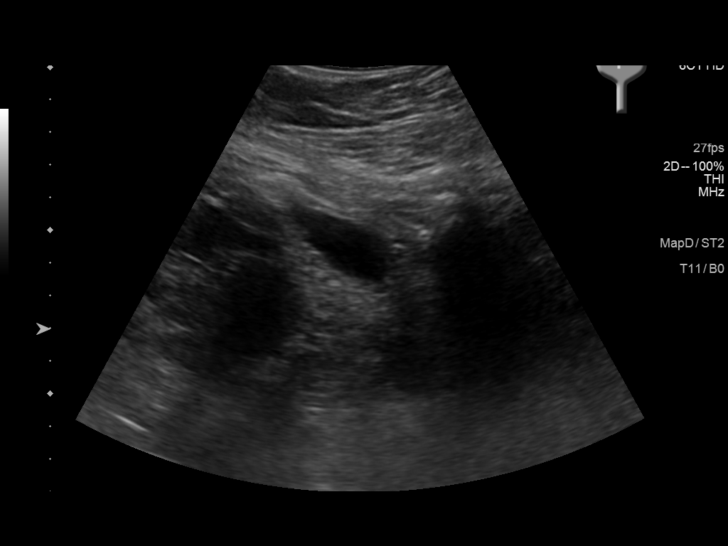
[im 37/41]
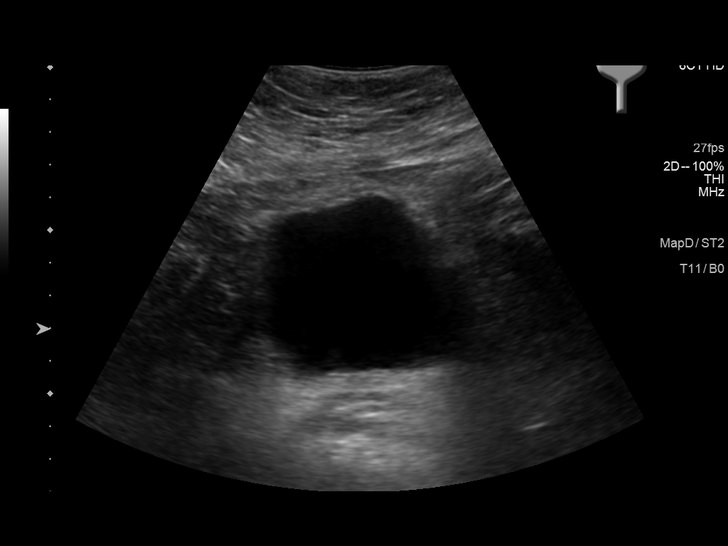
[im 41/41]
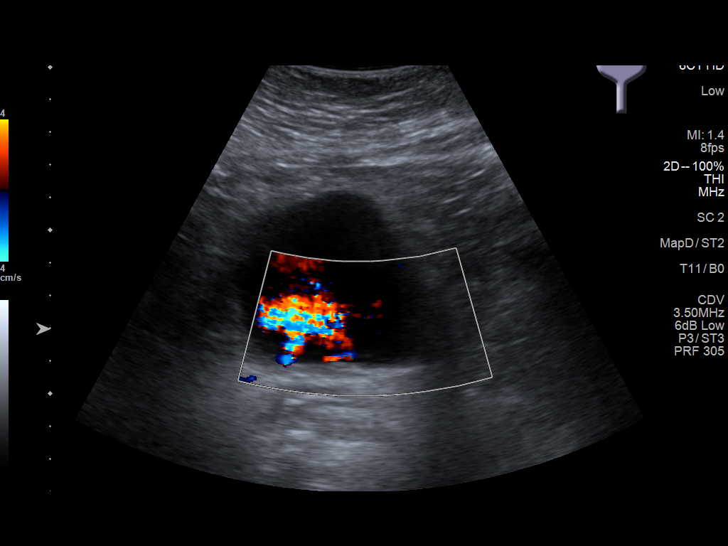

[14 of 25 positions shown; findings below may reference images not displayed]

FINDINGS: Right Kidney:

Renal measurements: 10.0 x 4.1 x 5.0 cm = volume: 106 mL.
Echogenicity within normal limits. No mass or hydronephrosis
visualized. No nephrolithiasis

Left Kidney:

Renal measurements: 10.2 x 5.0 x 4.9 cm = volume: 120 mL.
Echogenicity within normal limits. No mass or hydronephrosis
visualized. No nephrolithiasis

Bladder:

Appears normal for degree of bladder distention.

Other:

None.
IMPRESSION: Normal renal ultrasound

## 2022-04-09 NOTE — Progress Notes (Deleted)
Cardiology Office Note:    Date:  04/09/2022   ID:  [name], DOB 1956/02/17, MRN [id number]  PCP:  Rayetta Humphrey, MD  [facility] Cardiologist:  Lorine Bears, MD  [facility] Electrophysiologist:  None   Referring MD: Rayetta Humphrey, MD   Chief Complaint: 4 month follow-up  History of Present Illness:    Jade Edwards is a 66 y.o. 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    In April 2022, she was admitted with acute right foot pain secondary to acute limb ischemia.  Angiogram showed abrupt occlusion of the right popliteal artery as well as occlusion of all 3 tibial vessels in the proximal segment.  She underwent thrombectomy and balloon angioplasty.  She was subsequently placed on Eliquis.  Event monitoring in June 2022 showed 53 episodes of SVT, the fastest 197 bpm x 13.1 seconds.  Some SVT noted was felt to potentially represent atrial tachycardia versus atrial fibrillation.  In the setting of prior embolism, recommendations made for continued oral anticoagulation.  Echocardiogram earlier this month showed normal LV function (60-65%), with trivial MR.   Seen 02/24/21 and was dong well. Intermittent hematuria on Eliquis. She was referred for sleep study. Metoprolol was started.   Last seen 09/2021 and did not start metoprolol, which was restarted. Eliquis was refilled. HCTZ was started for high BP.   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 11/2020 Angio: 100% R popliteal artery and all 3 tibial vessels in the proximal segment-->s/p thrombectomy & PTA.   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 02/2021 Echo: EF 60-65%, no rwma. Nl RV fxn. Triv MR.   Obesity    Osteoarthritis of right knee    PAF (paroxysmal atrial fibrillation) (HCC)    a. 01/2021 Zio: 53 runs SVT w/ ? of afib.  Eliquis started in light of h/o LE embolism.   Primary osteoarthritis of both knees    PSVT (paroxysmal supraventricular tachycardia) (HCC)    a. 01/2021 Zio: 53 SVT runs. Longest/fastest 13.1 secs @ 197. Some SVT possibly AT vs Afib-->Eliquis started.   Raynaud's disease without gangrene    Rheumatoid factor positive    Stricture and stenosis of esophagus     Past Surgical History:  Procedure Laterality Date   CESAREAN SECTION     ESOPHAGOGASTRODUODENOSCOPY N/A 02/21/2020   Procedure: ESOPHAGOGASTRODUODENOSCOPY (EGD);  Surgeon: Midge Minium, MD;  Location: [facility] ENDOSCOPY;  Service: Endoscopy;  Laterality: N/A;   ESOPHAGOGASTRODUODENOSCOPY (EGD) WITH PROPOFOL N/A 04/20/2020   Procedure: ESOPHAGOGASTRODUODENOSCOPY (EGD) WITH PROPOFOL;  Surgeon: Midge Minium, MD;  Location: [facility] ENDOSCOPY;  Service: Endoscopy;  Laterality: N/A;   LOWER EXTREMITY ANGIOGRAPHY Right 11/18/2020   Procedure: Lower Extremity Angiography;  Surgeon: Annice Needy, MD;  Location: [facility] INVASIVE CV LAB;  Service: Cardiovascular;  Laterality: Right;   VASCULAR SURGERY      Current Medications: No outpatient medications have been marked as taking for the 04/10/22 encounter (Appointment) with Fransico Michael, Jabria Loos H,  PA-C.     Allergies:   Codeine and Epinephrine   Social History   Socioeconomic History   Marital status: Married    Spouse name: Not on file   Number of children: Not on file   Years of education: Not on file   Highest education level: Not on file  Occupational History   Not on file  Tobacco Use   Smoking status: Never   Smokeless tobacco: Never  Vaping Use   Vaping Use: Never used  Substance and Sexual Activity   Alcohol use: No   Drug use:  No   Sexual activity: Not on file  Other Topics Concern   Not on file  Social History Narrative   No smoking; no alcohol. 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 Lives [place].    Social Determinants of Health   Financial Resource Strain: Not on file  Food Insecurity: Not on file  Transportation Needs: Not on file  Physical Activity: Not on file  Stress: Not on file  Social Connections: Not on file     Family History: The patient's ***family history includes Bipolar disorder in her mother; Breast cancer in her mother; Coronary artery disease in her father; Depression in her mother; Heart attack (age of onset: 58) in her father; Hyperlipidemia in her mother; Lupus in her mother; Mental illness in her mother; Osteoarthritis in her daughter and son; Other in her mother and sister; Rheum arthritis in her mother; Stroke in her mother. There is no history of Bladder Cancer, Prostate cancer, or Kidney cancer.  ROS:   Please see the history of present illness.    *** All other systems reviewed and are negative.  EKGs/Labs/Other Studies Reviewed:    The following studies were reviewed today: ***  EKG:  EKG is *** ordered today.  The ekg ordered today demonstrates ***  Recent Labs: 10/18/2021: BUN 17; Creatinine, Ser 0.90; Potassium 4.3; Sodium 139  Recent Lipid Panel    Component Value Date/Time   CHOL 209 (H) 11/18/2012 1016   TRIG 172.0 (H) 11/18/2012 1016   HDL 48.30 11/18/2012 1016   CHOLHDL 4 11/18/2012 1016   VLDL 34.4 11/18/2012 1016   LDLCALC 115 (H) 11/22/2011 0822   LDLDIRECT 139.9 11/18/2012 1016     Risk Assessment/Calculations:   {Does this patient have ATRIAL FIBRILLATION?[phone]}   Physical Exam:    VS:  There were no vitals taken for this visit.    Wt Readings from Last 3 Encounters:  10/18/21 242 lb (109.8 kg)  10/11/21 245 lb (111.1 kg)  03/18/21 238 lb (108 kg)     GEN: *** Well nourished, well developed in no  acute distress HEENT: Normal NECK: No JVD; No carotid bruits LYMPHATICS: No lymphadenopathy CARDIAC: ***RRR, no murmurs, rubs, gallops RESPIRATORY:  Clear to auscultation without rales, wheezing or rhonchi  ABDOMEN: Soft, non-tender, non-distended MUSCULOSKELETAL:  No edema; No deformity  SKIN: Warm and dry NEUROLOGIC:  Alert and oriented x 3 PSYCHIATRIC:  Normal affect   ASSESSMENT:    No diagnosis found. PLAN:    In order of problems listed above:  PAF  Right lower extremity artrial thrombus  Sleep disorder  HLD  HTN  Disposition: Follow up {follow up:15908} with ***   Shared Decision Making/Informed Consent   {Are you ordering a CV Procedure (e.g. stress test, cath, DCCV, TEE, etc)?   Press F2        :[id number]}    Signed, Lorimer Tiberio David Stall,  PA-C  04/09/2022 8:45 PM    [facility]

## 2022-04-10 ENCOUNTER — Ambulatory Visit: Payer: No Typology Code available for payment source | Admitting: Medical

## 2022-04-17 ENCOUNTER — Ambulatory Visit (INDEPENDENT_AMBULATORY_CARE_PROVIDER_SITE_OTHER): Payer: No Typology Code available for payment source | Admitting: Nurse Practitioner

## 2022-04-17 ENCOUNTER — Ambulatory Visit (INDEPENDENT_AMBULATORY_CARE_PROVIDER_SITE_OTHER): Payer: No Typology Code available for payment source

## 2022-04-17 ENCOUNTER — Other Ambulatory Visit (INDEPENDENT_AMBULATORY_CARE_PROVIDER_SITE_OTHER): Payer: Self-pay | Admitting: Vascular Surgery

## 2022-04-17 DIAGNOSIS — Z9889 Other specified postprocedural states: Secondary | ICD-10-CM

## 2022-04-17 DIAGNOSIS — M79604 Pain in right leg: Secondary | ICD-10-CM

## 2022-04-17 DIAGNOSIS — I739 Peripheral vascular disease, unspecified: Secondary | ICD-10-CM | POA: Diagnosis not present

## 2022-06-19 ENCOUNTER — Encounter (INDEPENDENT_AMBULATORY_CARE_PROVIDER_SITE_OTHER): Payer: Self-pay

## 2022-07-01 IMAGING — CT CT CTA ABD/PEL W/CM AND/OR W/O CM
2 of 7 series · 13 of 46 positions shown, 15 images · IV contrast (omnipaque)
Comparison: Lower extremity venous duplex, 02/05/2021 and
08/08/2017. CT AP, 01/07/2021.

CLINICAL DATA: Arterial embolism. Reported history of lower ext
arterial embolism, Ebadat disease, GERD, ADD, and obesity, who
presents for follow-up after recent monitoring showed PSVT with
question of atrial fibrillation

EXAM:
CT ANGIOGRAPHY CHEST AND ABDOMEN
TECHNIQUE: Multidetector CT imaging of the chest and abdomen was performed
using the standard protocol during bolus administration of
intravenous contrast. Multiplanar CT image reconstructions and MIPs
were obtained to evaluate the vascular anatomy.
CONTRAST:  100mL OMNIPAQUE IOHEXOL 350 MG/ML SOLN

[Series 4: axial arterial arterial 2.00 · axial · arterial · 0.74mm/px · z∈[-1478,-946]mm · 10 of 312 slices shown, 12 images]
[im 23/312  soft-tissue]
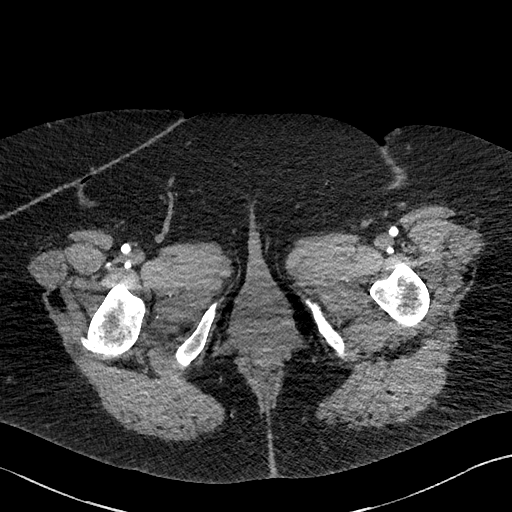
[im 23/312  bone]
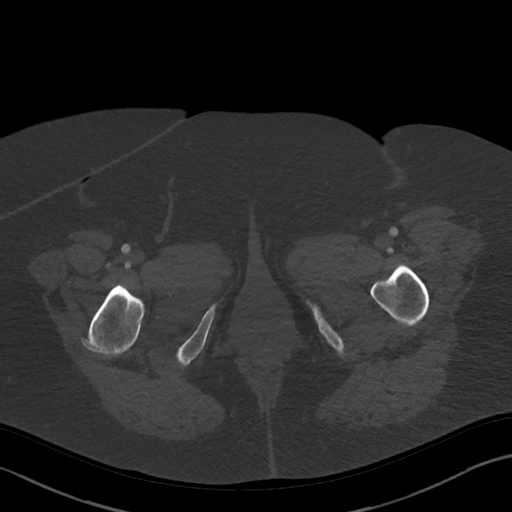
[im 45/312  soft-tissue]
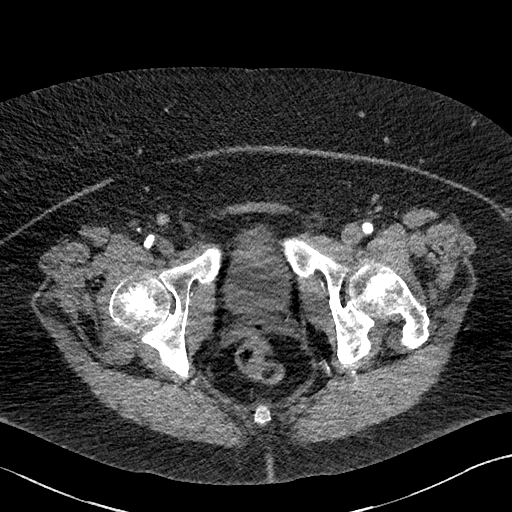
[im 89/312  soft-tissue]
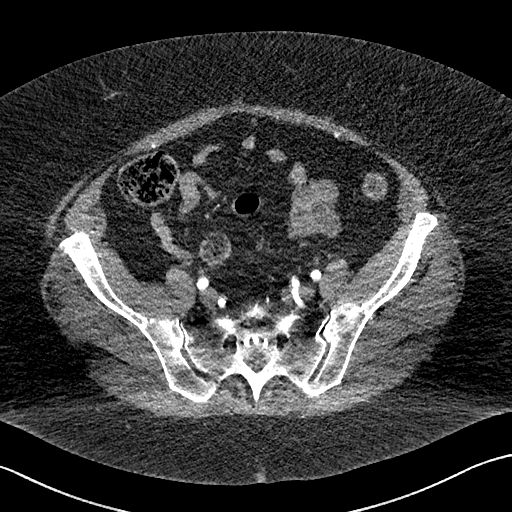
[im 112/312  soft-tissue]
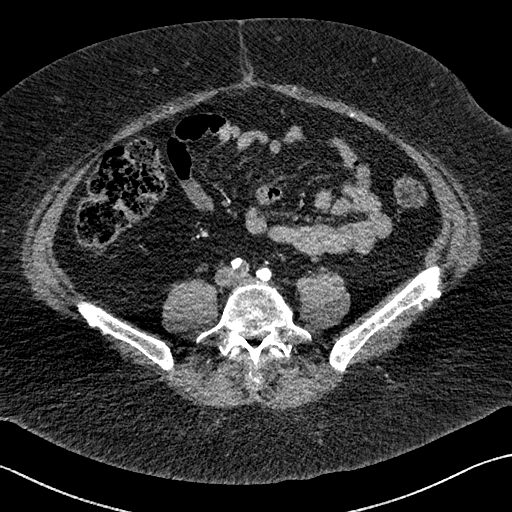
[im 134/312  soft-tissue]
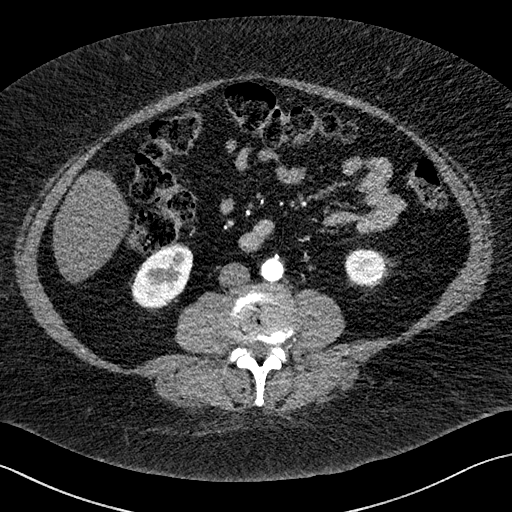
[im 178/312  soft-tissue]
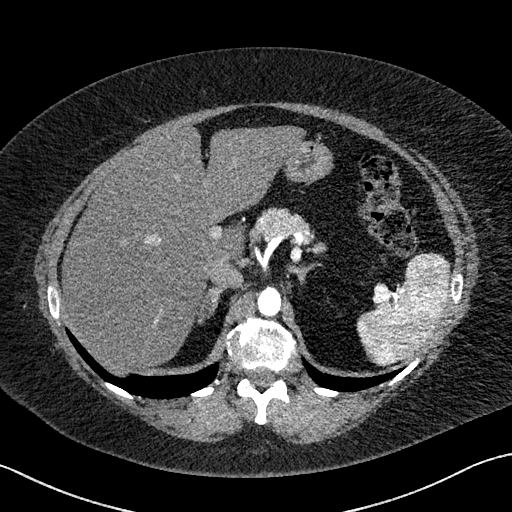
[im 200/312  soft-tissue]
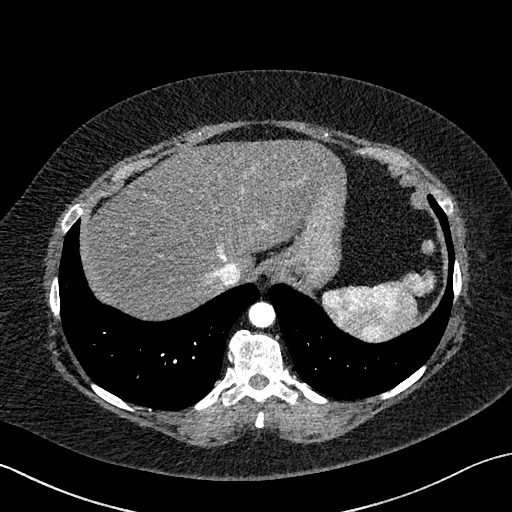
[im 223/312  soft-tissue]
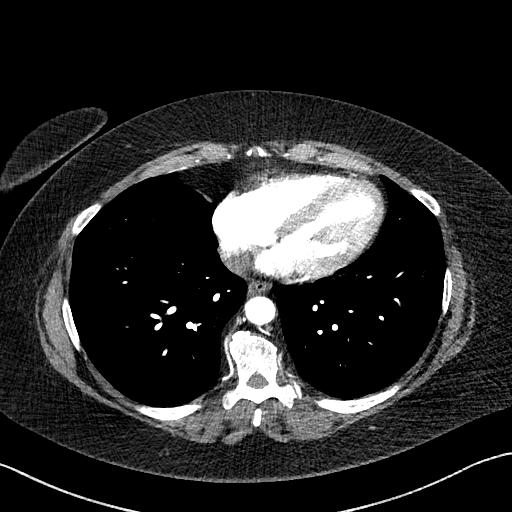
[im 267/312  soft-tissue]
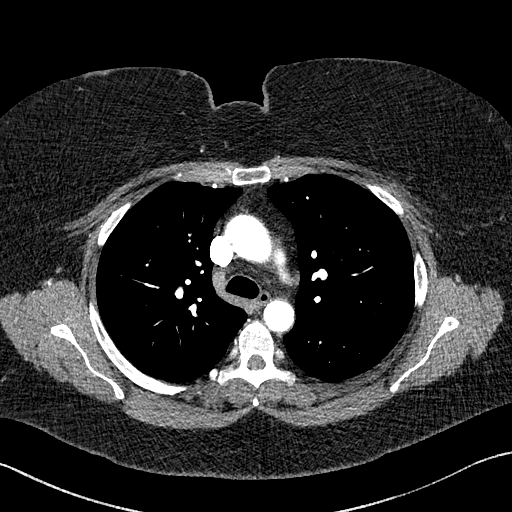
[im 267/312  bone]
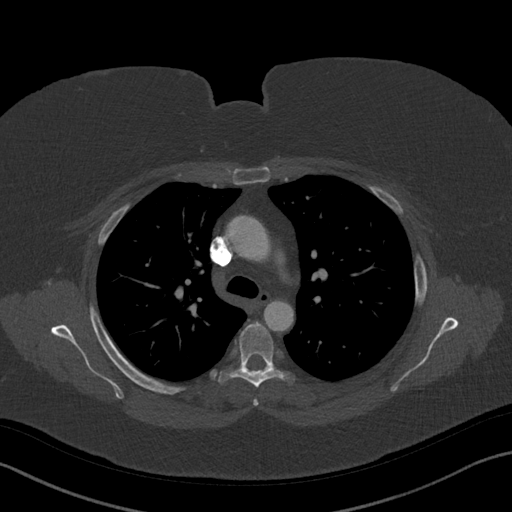
[im 289/312  soft-tissue]
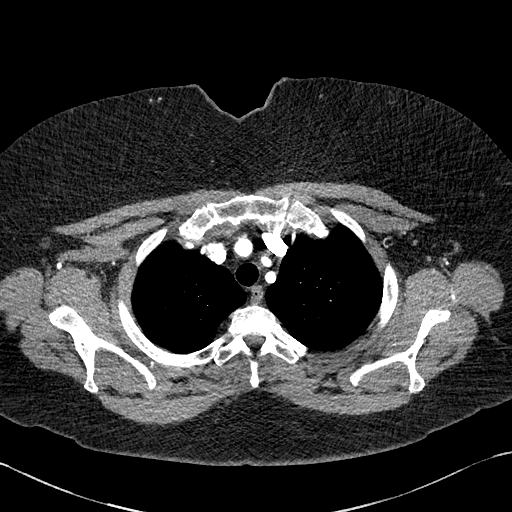

[Series 7: cor st arterial 2.00 cor · coronal · arterial · 0.80mm/px · 3 of 164 slices shown]
[im 41/164  soft-tissue]
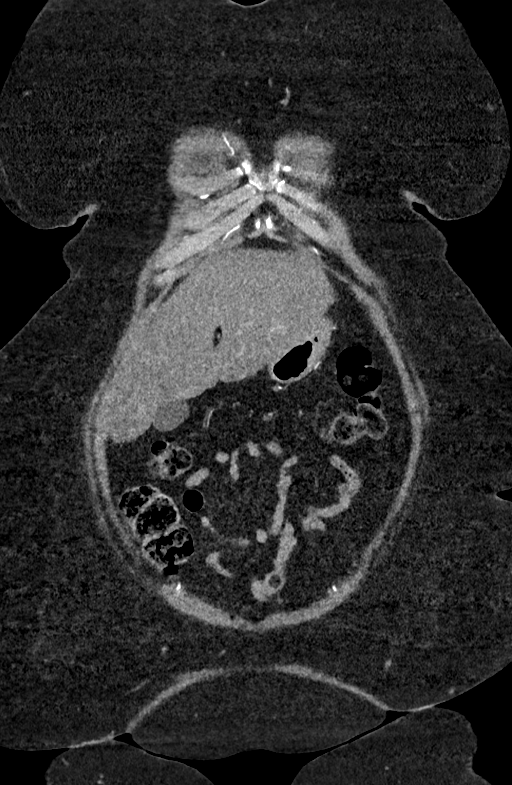
[im 82/164  soft-tissue]
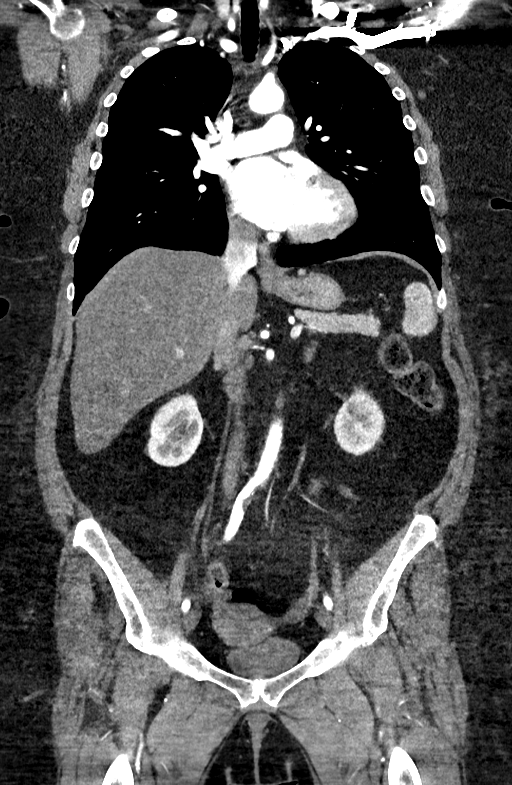
[im 123/164  soft-tissue]
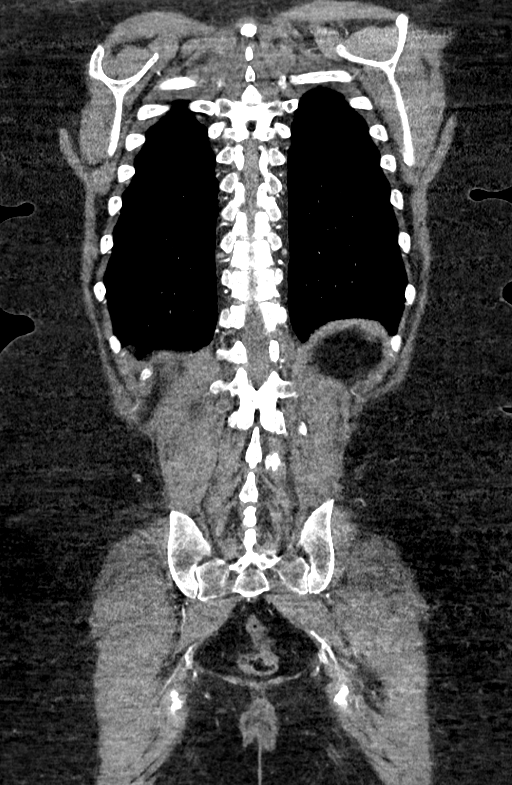

[13 of 46 positions shown; findings below may reference images not displayed]

FINDINGS: CTA CHEST FINDINGS

CARDIOVASCULAR:

Limitations by motion: Severe

Preferential opacification of the thoracic aorta. No evidence of
thoracic aortic aneurysm or dissection.

Aortic Root: Motion degraded, such that the aortic could not be
evaluated.

Thoracic Aorta:

--Ascending Aorta: 3.4 cm

--Aortic Arch: 2.7 cm

--Descending Aorta: 2.4 cm

Other:

Conventional 3 vessel LEFT aortic arch. No atherosclerotic disease
at the origins of the great vessels. Incidental dominant RIGHT
vertebral artery.

Normal heart size. No CTA evidence of ventricular or atrial
thrombus. No pericardial effusion. Coronary artery atherosclerosis,
greatest burden within the LEFT main and proximal LAD. No segmental
or larger pulmonary embolus.

Mediastinum/Nodes: No enlarged mediastinal, hilar, or axillary lymph
nodes. Thyroid gland, trachea, and esophagus demonstrate no
significant findings.

Lungs/Pleura: Lungs are clear. No pleural effusion or pneumothorax.

Musculoskeletal: No chest wall abnormality. No acute or significant
osseous findings.

Review of the MIP images confirms the above findings.

CTA ABDOMEN FINDINGS

Aorta: Normal caliber aorta without aneurysm, dissection, vasculitis
or significant stenosis.

Celiac: Widely patent without evidence of aneurysm, dissection,
vasculitis or significant stenosis.

SMA: Incidental replaced RIGHT hepatic artery. Widely patent without
evidence of aneurysm, dissection, vasculitis or significant
stenosis.

Renals: Single bilateral renal arteries are present. The renal
arteries are widely patent without evidence of aneurysm, dissection,
vasculitis, fibromuscular dysplasia or significant stenosis.

IMA: Widely patent without evidence of aneurysm, dissection,
vasculitis or significant stenosis.

Pelvis: Proximal RIGHT common iliac artery noncalcified atheromatous
disease with approximately 50% luminal encroachment/narrowing. See
key image. The LEFT common iliac artery is patent without evidence
of aneurysm, dissection, vasculitis or significant stenosis.

Veins: No obvious venous abnormality within the limitations of this
arterial phase study.

Hepatobiliary: No focal lesion or mass.  Normal gallbladder.

Pancreas: Normal arterial phase evaluation. No pancreatic duct
dilation.

Spleen: Normal.

Adrenals/Urinary Tract: Normal appearance of the adrenal glands. The
kidneys are normal without focal lesion or mass, renal calculi or
hydronephrosis. Normal appearance of the urinary bladder.

Stomach/Bowel: Normal stomach. The appendix is not definitively
visualized. Nonobstructed small bowel. Nondilated colon.

Lymphatic: No abdominopelvic adenopathy.

Musculoskeletal: No acute or significant osseous findings

Other: No abdominal wall abnormality or abdominopelvic ascites.

Review of the MIP images confirms the above findings.
IMPRESSION: 1. No CTA evidence of ventricular or atrial thrombus, or findings of
arterial embolic disease within the abdomen or pelvis.
2. Proximal RIGHT CIA noncalcified atherosclerosis with 50%
stenosis. See key image.
3. No acute thoraco-abdominal or pelvic findings.

## 2022-07-01 IMAGING — CT CT ANGIO CHEST
2 of 7 series · 12 of 36 positions shown · IV contrast (omnipaque)
Comparison: Lower extremity venous duplex, 02/05/2021 and
08/08/2017. CT AP, 01/07/2021.

CLINICAL DATA: Arterial embolism. Reported history of lower ext
arterial embolism, Ebadat disease, GERD, ADD, and obesity, who
presents for follow-up after recent monitoring showed PSVT with
question of atrial fibrillation

EXAM:
CT ANGIOGRAPHY CHEST AND ABDOMEN
TECHNIQUE: Multidetector CT imaging of the chest and abdomen was performed
using the standard protocol during bolus administration of
intravenous contrast. Multiplanar CT image reconstructions and MIPs
were obtained to evaluate the vascular anatomy.
CONTRAST:  100mL OMNIPAQUE IOHEXOL 350 MG/ML SOLN

[Series 4: axial arterial arterial 2.00 · axial · arterial · 0.74mm/px · z∈[-1472,-952]mm · 11 of 312 slices shown]
[im 26/312  lung]
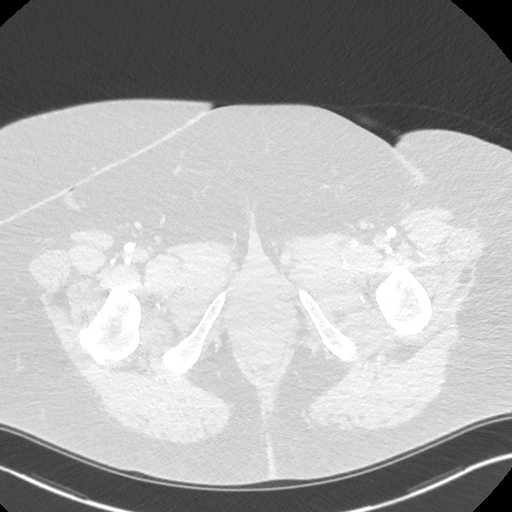
[im 52/312  mediastinal]
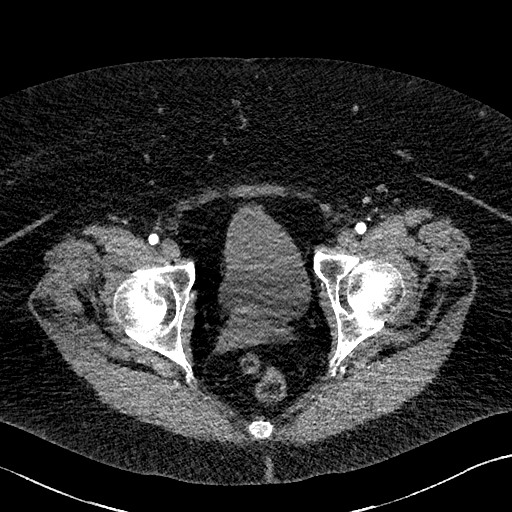
[im 78/312  lung]
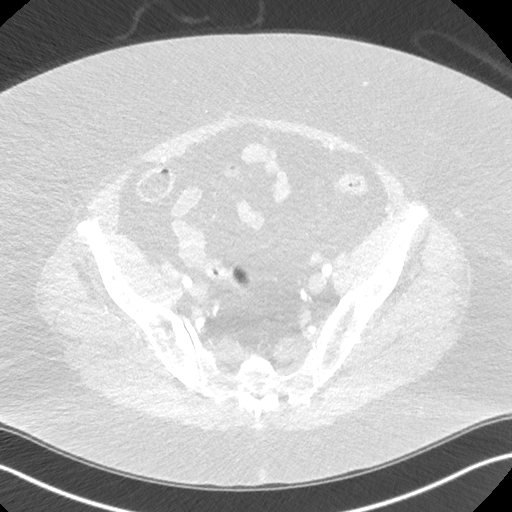
[im 104/312  mediastinal]
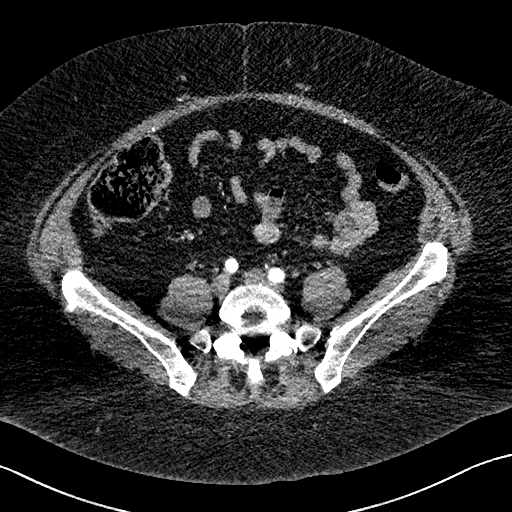
[im 130/312  lung]
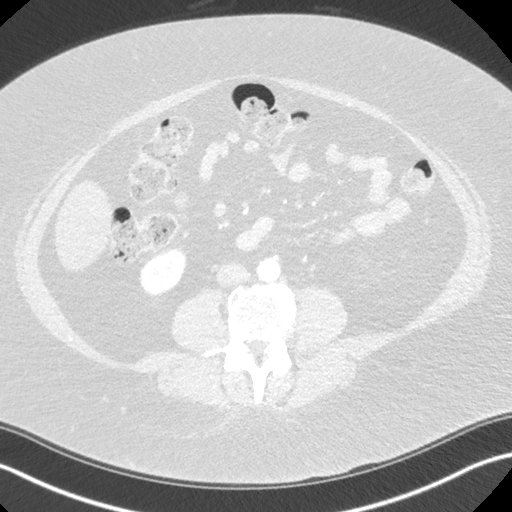
[im 156/312  mediastinal]
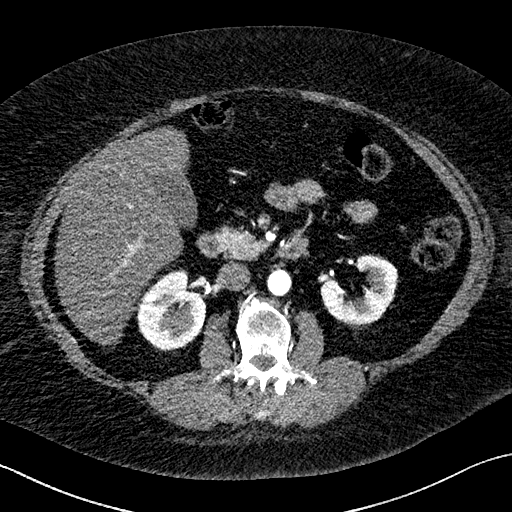
[im 182/312  lung]
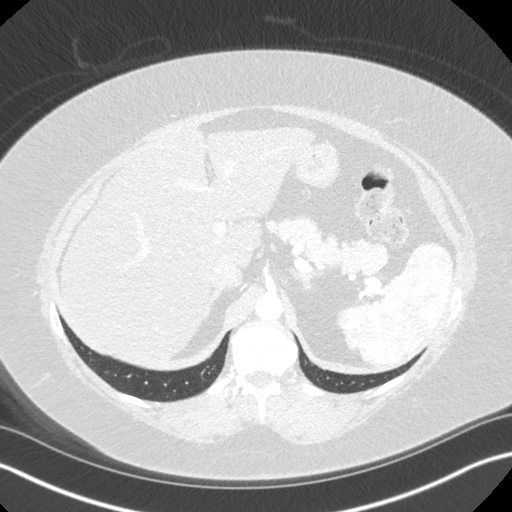
[im 208/312  mediastinal]
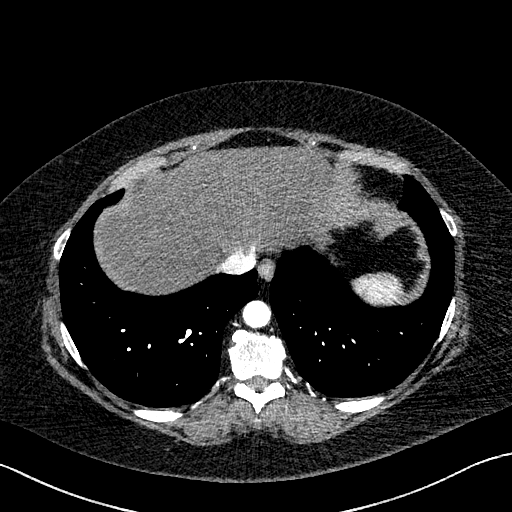
[im 234/312  lung]
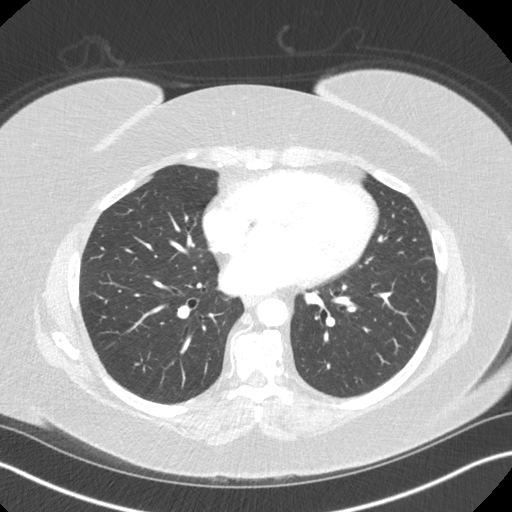
[im 260/312  mediastinal]
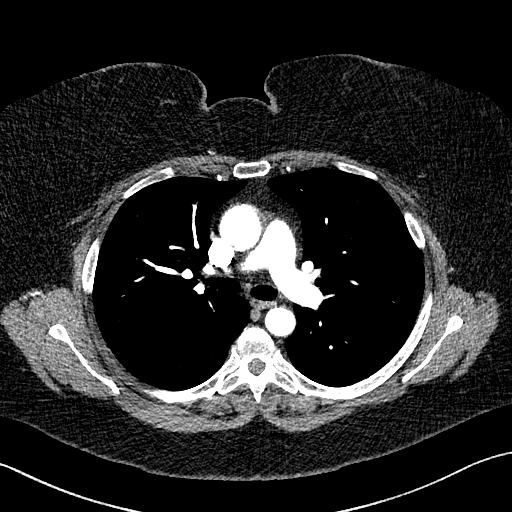
[im 286/312  lung]
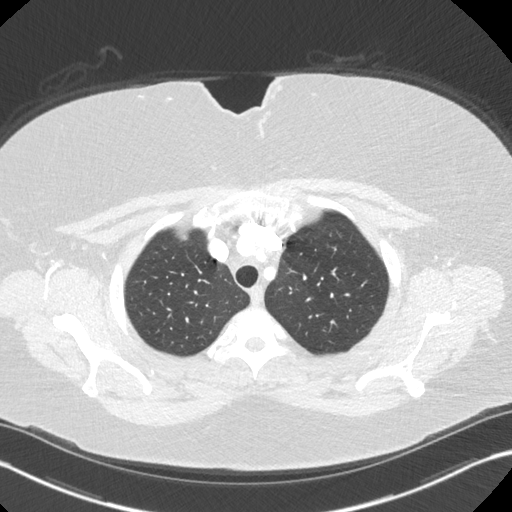

[Series 7: cor st arterial 2.00 cor · coronal · arterial · 0.80mm/px · 1 of 164 slices shown]
[im 82/164  mediastinal]
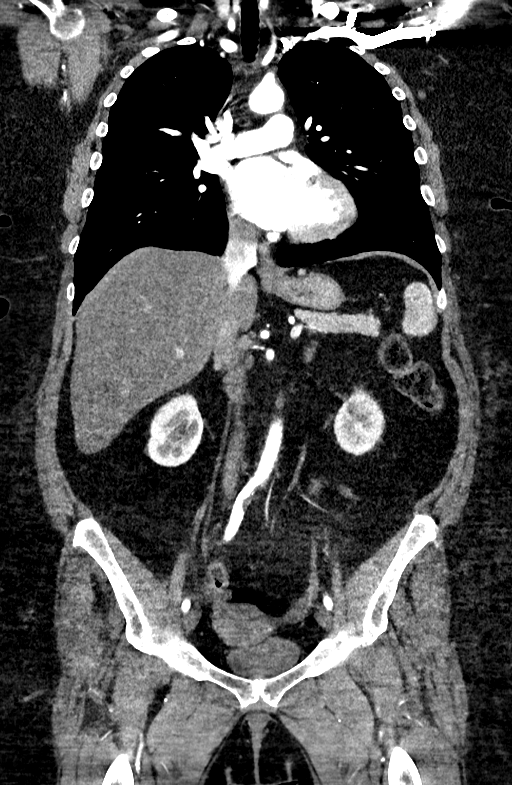

[12 of 36 positions shown; findings below may reference images not displayed]

FINDINGS: CTA CHEST FINDINGS

CARDIOVASCULAR:

Limitations by motion: Severe

Preferential opacification of the thoracic aorta. No evidence of
thoracic aortic aneurysm or dissection.

Aortic Root: Motion degraded, such that the aortic could not be
evaluated.

Thoracic Aorta:

--Ascending Aorta: 3.4 cm

--Aortic Arch: 2.7 cm

--Descending Aorta: 2.4 cm

Other:

Conventional 3 vessel LEFT aortic arch. No atherosclerotic disease
at the origins of the great vessels. Incidental dominant RIGHT
vertebral artery.

Normal heart size. No CTA evidence of ventricular or atrial
thrombus. No pericardial effusion. Coronary artery atherosclerosis,
greatest burden within the LEFT main and proximal LAD. No segmental
or larger pulmonary embolus.

Mediastinum/Nodes: No enlarged mediastinal, hilar, or axillary lymph
nodes. Thyroid gland, trachea, and esophagus demonstrate no
significant findings.

Lungs/Pleura: Lungs are clear. No pleural effusion or pneumothorax.

Musculoskeletal: No chest wall abnormality. No acute or significant
osseous findings.

Review of the MIP images confirms the above findings.

CTA ABDOMEN FINDINGS

Aorta: Normal caliber aorta without aneurysm, dissection, vasculitis
or significant stenosis.

Celiac: Widely patent without evidence of aneurysm, dissection,
vasculitis or significant stenosis.

SMA: Incidental replaced RIGHT hepatic artery. Widely patent without
evidence of aneurysm, dissection, vasculitis or significant
stenosis.

Renals: Single bilateral renal arteries are present. The renal
arteries are widely patent without evidence of aneurysm, dissection,
vasculitis, fibromuscular dysplasia or significant stenosis.

IMA: Widely patent without evidence of aneurysm, dissection,
vasculitis or significant stenosis.

Pelvis: Proximal RIGHT common iliac artery noncalcified atheromatous
disease with approximately 50% luminal encroachment/narrowing. See
key image. The LEFT common iliac artery is patent without evidence
of aneurysm, dissection, vasculitis or significant stenosis.

Veins: No obvious venous abnormality within the limitations of this
arterial phase study.

Hepatobiliary: No focal lesion or mass.  Normal gallbladder.

Pancreas: Normal arterial phase evaluation. No pancreatic duct
dilation.

Spleen: Normal.

Adrenals/Urinary Tract: Normal appearance of the adrenal glands. The
kidneys are normal without focal lesion or mass, renal calculi or
hydronephrosis. Normal appearance of the urinary bladder.

Stomach/Bowel: Normal stomach. The appendix is not definitively
visualized. Nonobstructed small bowel. Nondilated colon.

Lymphatic: No abdominopelvic adenopathy.

Musculoskeletal: No acute or significant osseous findings

Other: No abdominal wall abnormality or abdominopelvic ascites.

Review of the MIP images confirms the above findings.
IMPRESSION: 1. No CTA evidence of ventricular or atrial thrombus, or findings of
arterial embolic disease within the abdomen or pelvis.
2. Proximal RIGHT CIA noncalcified atherosclerosis with 50%
stenosis. See key image.
3. No acute thoraco-abdominal or pelvic findings.

## 2022-10-03 ENCOUNTER — Encounter (INDEPENDENT_AMBULATORY_CARE_PROVIDER_SITE_OTHER): Payer: No Typology Code available for payment source

## 2022-10-03 ENCOUNTER — Ambulatory Visit (INDEPENDENT_AMBULATORY_CARE_PROVIDER_SITE_OTHER): Payer: No Typology Code available for payment source | Admitting: Nurse Practitioner

## 2022-10-05 ENCOUNTER — Emergency Department
Admission: EM | Admit: 2022-10-05 | Discharge: 2022-10-06 | Disposition: A | Discharge status: Home / Self Care | Payer: Managed Care, Other (non HMO) | Attending: Emergency Medicine | Admitting: Emergency Medicine

## 2022-10-05 ENCOUNTER — Emergency Department: Payer: Managed Care, Other (non HMO)

## 2022-10-05 DIAGNOSIS — Z86718 Personal history of other venous thrombosis and embolism: Secondary | ICD-10-CM | POA: Insufficient documentation

## 2022-10-05 DIAGNOSIS — Z7982 Long term (current) use of aspirin: Secondary | ICD-10-CM | POA: Insufficient documentation

## 2022-10-05 DIAGNOSIS — I493 Ventricular premature depolarization: Secondary | ICD-10-CM

## 2022-10-05 DIAGNOSIS — Z8616 Personal history of COVID-19: Secondary | ICD-10-CM | POA: Diagnosis not present

## 2022-10-05 DIAGNOSIS — Z7901 Long term (current) use of anticoagulants: Secondary | ICD-10-CM | POA: Insufficient documentation

## 2022-10-05 DIAGNOSIS — R079 Chest pain, unspecified: Secondary | ICD-10-CM | POA: Diagnosis present

## 2022-10-05 DIAGNOSIS — R0789 Other chest pain: Secondary | ICD-10-CM

## 2022-10-05 LAB — BASIC METABOLIC PANEL
Anion gap: 10 (ref 5–15)
BUN: 15 mg/dL (ref 8–23)
CO2: 24 mmol/L (ref 22–32)
Calcium: 8.6 mg/dL — ABNORMAL LOW (ref 8.9–10.3)
Chloride: 101 mmol/L (ref 98–111)
Creatinine, Ser: 0.77 mg/dL (ref 0.44–1.00)
GFR, Estimated: >60 mL/min (ref >60)
Glucose, Bld: 117 mg/dL — ABNORMAL HIGH (ref 70–99)
Potassium: 3.6 mmol/L (ref 3.5–5.1)
Sodium: 135 mmol/L (ref 135–145)

## 2022-10-05 LAB — CBC
HCT: 45.2 % (ref 36.0–46.0)
Hemoglobin: 14 g/dL (ref 12.0–15.0)
MCH: 28 pg (ref 26.0–34.0)
MCHC: 31 g/dL (ref 30.0–36.0)
MCV: 90.4 fL (ref 80.0–100.0)
Platelets: 324 10*3/uL (ref 150–400)
RBC: 5 MIL/uL (ref 3.87–5.11)
RDW: 14.5 % (ref 11.5–15.5)
WBC: 9.1 10*3/uL (ref 4.0–10.5)
nRBC: 0 % (ref 0.0–0.2)

## 2022-10-05 LAB — TROPONIN I (HIGH SENSITIVITY): Troponin I (High Sensitivity): 7 ng/L (ref <18)

## 2022-10-05 NOTE — ED Provider Notes (Signed)
[facility] Provider Note    Event Date/Time   First MD Initiated Contact with Patient 10/05/22 2319     (approximate)   History   Chest Pain   HPI  Jade Edwards is a 67 y.o. 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  No shortness of breath.  She denies any chest pain, pressure or tightness.  No fevers, cough, nausea or vomiting.  Has an appointment to see her cardiologist tomorrow.   History provided by patient, family.    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 11/2020 Angio: 100% R popliteal artery and all 3 tibial vessels in the proximal segment-->s/p thrombectomy & PTA.   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 02/2021 Echo: EF 60-65%, no rwma. Nl RV fxn. Triv MR.   Obesity    Osteoarthritis of right knee    PAF (paroxysmal atrial fibrillation) ([place])    a. 01/2021 Zio: 53 runs SVT w/ ? of afib.  Eliquis started in light of h/o LE embolism.   Primary osteoarthritis of both knees    PSVT (paroxysmal supraventricular tachycardia)    a. 01/2021 Zio: 53 SVT runs. Longest/fastest 13.1 secs @ 197. Some SVT possibly AT vs Afib-->Eliquis started.   Raynaud's disease without gangrene    Rheumatoid factor positive    Stricture and stenosis of esophagus     Past Surgical History:  Procedure Laterality Date   CESAREAN SECTION     ESOPHAGOGASTRODUODENOSCOPY N/A  02/21/2020   Procedure: ESOPHAGOGASTRODUODENOSCOPY (EGD);  Surgeon: Lucilla Lame, MD;  Location: [facility] ENDOSCOPY;  Service: Endoscopy;  Laterality: N/A;   ESOPHAGOGASTRODUODENOSCOPY (EGD) WITH PROPOFOL N/A 04/20/2020   Procedure: ESOPHAGOGASTRODUODENOSCOPY (EGD) WITH PROPOFOL;  Surgeon: Lucilla Lame, MD;  Location: [facility] ENDOSCOPY;  Service: Endoscopy;  Laterality: N/A;   LOWER EXTREMITY ANGIOGRAPHY Right 11/18/2020   Procedure: Lower Extremity Angiography;  Surgeon: Algernon Huxley, MD;  Location: [place] [facility] LAB;  Service: Cardiovascular;  Laterality: Right;   VASCULAR SURGERY      MEDICATIONS:  Prior to Admission medications   Medication Sig Start Date End Date Taking? Authorizing Provider  amphetamine-dextroamphetamine (ADDERALL) 20 MG tablet Take 20 mg by mouth daily.    [provider]  apixaban (ELIQUIS) 5 MG TABS tablet Take 1 tablet (5 mg total) by mouth 2 (two) times daily. 10/11/21 01/09/22  Furth, Cadence H, PA-C  aspirin EC 81 MG EC tablet Take 1 tablet (81 mg total) by mouth daily. Swallow whole. 11/20/20   Enzo Bi, MD  atorvastatin (LIPITOR) 10 MG tablet Take 1 tablet (10 mg total) by mouth daily. 11/20/20   Enzo Bi, MD  hydrochlorothiazide (MICROZIDE) 12.5 MG capsule Take 1 capsule (12.5 mg total) by mouth daily. 10/11/21 01/09/22  Furth, Cadence H, PA-C  metoprolol tartrate (LOPRESSOR) 25 MG tablet Take 1 tablet (25 mg total) by mouth 2 (two)  times daily. 10/11/21 01/09/22  Antony Madura, PA-C    Physical Exam   Triage Vital Signs: ED Triage Vitals  Enc Vitals Group     BP 10/05/22 1959 (!) 160/86     Pulse Rate 10/05/22 1959 91     Resp 10/05/22 1959 15     Temp --      Temp src --      SpO2 10/05/22 1959 100 %     Weight --      Height --      Head Circumference --      Peak Flow --      Pain Score 10/05/22 1957 6     Pain Loc --      Pain Edu? --      Excl. in [place]? --     Most recent vital signs: Vitals:   10/06/22 0000 10/06/22 0030  BP: (!) 145/63  (!) 152/63  Pulse: 74 74  Resp: 15 14  Temp:    SpO2: 98% 99%    CONSTITUTIONAL: Alert, responds appropriately to questions. Well-appearing; well-nourished HEAD: Normocephalic, atraumatic EYES: Conjunctivae clear, pupils appear equal, sclera nonicteric ENT: normal nose; moist mucous membranes NECK: Supple, normal ROM CARD: RRR; S1 and S2 appreciated; + murmur; occasional PVCs noted on cardiac monitoring RESP: Normal chest excursion without splinting or tachypnea; breath sounds clear and equal bilaterally; no wheezes, no rhonchi, no rales, no hypoxia or respiratory distress, speaking full sentences ABD/GI: Non-distended; soft, non-tender, no rebound, no guarding, no peritoneal signs BACK: The back appears normal EXT: Normal ROM in all joints; no deformity noted, no edema, compartments soft, extremities warm and well-perfused, no calf tenderness or calf swelling SKIN: Normal color for age and race; warm; no rash on exposed skin NEURO: Moves all extremities equally, normal speech PSYCH: The patient's mood and manner are appropriate.   ED Results / Procedures / Treatments   LABS: (all labs ordered are listed, but only abnormal results are displayed) Labs Reviewed  BASIC METABOLIC PANEL - Abnormal; Notable for the following components:      Result Value   Glucose, Bld 117 (*)    Calcium 8.6 (*)    All other components within normal limits  CBC  D-DIMER, QUANTITATIVE  MAGNESIUM  TSH  TROPONIN I (HIGH SENSITIVITY)  TROPONIN I (HIGH SENSITIVITY)     EKG:  EKG Interpretation  Date/Time:  Thursday October 05 2022 19:53:31 EST Ventricular Rate:  92 PR Interval:  170 QRS Duration: 70 QT Interval:  366 QTC Calculation: 452 R Axis:   -5 Text Interpretation: Sinus rhythm with frequent Premature ventricular complexes Low voltage QRS Cannot rule out Anterior infarct (cited on or before 18-Nov-2020) Abnormal ECG When compared with ECG of 18-Nov-2020 10:21, Premature ventricular  complexes are now Present Nonspecific T wave abnormality now evident in Anterior leads Confirmed by Pryor Curia [phone]) on 10/05/2022 11:20:56 PM         RADIOLOGY: My personal review and interpretation of imaging: X-ray clear.  No cardiomegaly or widened mediastinum.  I have personally reviewed all radiology reports.   DG Chest 2 View  Result Date: 10/05/2022 CLINICAL DATA:  Chest pain since 4 a.m. this morning. EXAM: CHEST - 2 VIEW COMPARISON:  02/21/2020 FINDINGS: The heart size and mediastinal contours are within normal limits. Both lungs are clear. The visualized skeletal structures are unremarkable. IMPRESSION: No active cardiopulmonary disease. Electronically Signed   By: Lucienne Capers M.D.   On: 10/05/2022 20:26  PROCEDURES:  Critical Care performed: No     .1-3 Lead EKG Interpretation  Performed by: Zayvon Alicea, Delice Bison, DO Authorized by: Jiyah Torpey, Delice Bison, DO     Interpretation: abnormal     ECG rate:  74   ECG rate assessment: normal     Rhythm: sinus rhythm     Ectopy: PVCs     Conduction: normal       IMPRESSION / MDM / ASSESSMENT AND PLAN / ED COURSE  I reviewed the triage vital signs and the nursing notes.    Patient here with complaints of feeling like her chest is vibrating.  She states it feels like if someone put her cell phone inside of her and was calling her with her phone on silent.  She denies having any chest pain or shortness of breath.  She has never had anything like this before.  No recent infectious symptoms.  Is having occasional PVCs.  Has an appointment to see cardiology tomorrow.  The patient is on the cardiac monitor to evaluate for evidence of arrhythmia and/or significant heart rate changes.   DIFFERENTIAL DIAGNOSIS (includes but not limited to):   Arrhythmia, electrolyte derangement, anemia, less likely ACS, PE, dissection, pneumonia, CHF   Patient's presentation is most consistent with acute presentation with potential threat to  life or bodily function.   PLAN: Will obtain CBC, BMP, TSH, magnesium level, troponin x 2, D-dimer, chest x-ray.  EKG shows occasional PVCs but no other significant abnormality compared to previous.  Will continue to closely monitor on cardiac monitoring.   MEDICATIONS GIVEN IN ED: Medications - No data to display   ED COURSE: Labs show normal hemoglobin, normal electrolytes, TSH.  Troponin x 2 negative.  D-dimer negative.  Chest x-ray reviewed and interpreted by myself and the radiologist and shows no infiltrate, edema, widened mediastinum or cardiomegaly.  No pneumothorax.  No evidence seen on cardiac monitoring other than occasional PVCs.  Given symptoms are very atypical for ACS and she denies pain just a sensation of vibration, I feel she can follow-up with her cardiologist tomorrow as scheduled.  Patient and family comfortable with this plan.   At this time, I do not feel there is any life-threatening condition present. I reviewed all nursing notes, vitals, pertinent previous records.  All lab and urine results, EKGs, imaging ordered have been independently reviewed and interpreted by myself.  I reviewed all available radiology reports from any imaging ordered this visit.  Based on my assessment, I feel the patient is safe to be discharged home without further emergent workup and can continue workup as an outpatient as needed. Discussed all findings, treatment plan as well as usual and customary return precautions.  They verbalize understanding and are comfortable with this plan.  Outpatient follow-up has been provided as needed.  All questions have been answered.    CONSULTS:  none   OUTSIDE RECORDS REVIEWED: Reviewed last cardiology note on 10/11/2021.       FINAL CLINICAL IMPRESSION(S) / ED DIAGNOSES   Final diagnoses:  Chest discomfort  PVC's (premature ventricular contractions)     Rx / DC Orders   ED Discharge Orders     None        Note:  This document was  prepared using Dragon voice recognition software and may include unintentional dictation errors.   Zoella Roberti, Delice Bison, DO 10/06/22 0111

## 2022-10-05 NOTE — ED Triage Notes (Addendum)
Pt presents to ED via POV c/o chest pain. Pt states she woke up this morning around 4am with pounding  in chest middle chest. Pt now having what feels like "vibrations" in chest. Pt reports hx of afib, takes eliquis. Pt denies SOB, lightheadedness, N/V.

## 2022-10-06 ENCOUNTER — Ambulatory Visit: Payer: Managed Care, Other (non HMO)

## 2022-10-06 ENCOUNTER — Ambulatory Visit: Payer: Managed Care, Other (non HMO) | Attending: Nurse Practitioner | Admitting: Nurse Practitioner

## 2022-10-06 ENCOUNTER — Encounter: Payer: Self-pay | Admitting: Nurse Practitioner

## 2022-10-06 VITALS — BP 150/76 | HR 83 | Ht 63.0 in | Wt 242.4 lb

## 2022-10-06 DIAGNOSIS — I471 Supraventricular tachycardia, unspecified: Secondary | ICD-10-CM

## 2022-10-06 DIAGNOSIS — I480 Paroxysmal atrial fibrillation: Secondary | ICD-10-CM | POA: Diagnosis not present

## 2022-10-06 DIAGNOSIS — G4730 Sleep apnea, unspecified: Secondary | ICD-10-CM

## 2022-10-06 DIAGNOSIS — R002 Palpitations: Secondary | ICD-10-CM | POA: Diagnosis not present

## 2022-10-06 DIAGNOSIS — I10 Essential (primary) hypertension: Secondary | ICD-10-CM

## 2022-10-06 DIAGNOSIS — I749 Embolism and thrombosis of unspecified artery: Secondary | ICD-10-CM

## 2022-10-06 DIAGNOSIS — I493 Ventricular premature depolarization: Secondary | ICD-10-CM

## 2022-10-06 LAB — TROPONIN I (HIGH SENSITIVITY): Troponin I (High Sensitivity): 7 ng/L (ref <18)

## 2022-10-06 LAB — MAGNESIUM: Magnesium: 2.4 mg/dL (ref 1.7–2.4)

## 2022-10-06 LAB — D-DIMER, QUANTITATIVE: D-Dimer, Quant: 0.34 ug/mL-FEU (ref 0.00–0.50)

## 2022-10-06 LAB — TSH: TSH: 1.221 u[IU]/mL (ref 0.350–4.500)

## 2022-10-06 NOTE — Progress Notes (Signed)
Office Visit    Patient Name: Jade Edwards Date of Encounter: 10/06/2022  Primary Care Provider:  Sharyne Peach, MD Primary Cardiologist:  Kathlyn Sacramento, MD  Chief Complaint    67 year old female with a history of lower extremity arterial embolism, Raynaud's disease, paroxysmal atrial fibrillation, PSVT, GERD, attention deficit disorder with hyperactivity, and obesity, who presents for follow-up related to palpitations.  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 11/2020 Angio: 100% R popliteal artery and all 3 tibial vessels in the proximal segment-->s/p thrombectomy & PTA.   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 02/2021 Echo: EF 60-65%, no rwma. Nl RV fxn. Triv MR.   Obesity    Osteoarthritis of right knee    PAF (paroxysmal atrial fibrillation) ([place])    a. 01/2021 Zio: 53 runs SVT w/ ? of afib.  Eliquis started in light of h/o LE embolism.   Primary osteoarthritis of both knees    PSVT (paroxysmal supraventricular tachycardia)    a. 01/2021 Zio: 53 SVT runs. Longest/fastest 13.1 secs @ 197. Some SVT possibly AT vs Afib-->Eliquis started.   Raynaud's disease without gangrene    Rheumatoid factor positive    Stricture and stenosis of esophagus    Past Surgical History:  Procedure Laterality Date   CESAREAN SECTION     ESOPHAGOGASTRODUODENOSCOPY N/A 02/21/2020   Procedure: ESOPHAGOGASTRODUODENOSCOPY (EGD);  Surgeon: Lucilla Lame, MD;  Location: [facility] ENDOSCOPY;  Service: Endoscopy;  Laterality: N/A;   ESOPHAGOGASTRODUODENOSCOPY (EGD) WITH PROPOFOL N/A 04/20/2020   Procedure: ESOPHAGOGASTRODUODENOSCOPY (EGD) WITH PROPOFOL;  Surgeon:  Lucilla Lame, MD;  Location: [facility] ENDOSCOPY;  Service: Endoscopy;  Laterality: N/A;   LOWER EXTREMITY ANGIOGRAPHY Right 11/18/2020   Procedure: Lower Extremity Angiography;  Surgeon: Algernon Huxley, MD;  Location: [place] [facility] LAB;  Service: Cardiovascular;  Laterality: Right;   VASCULAR SURGERY      Allergies  Allergies  Allergen Reactions   Codeine Nausea Only   Epinephrine Palpitations    History of Present Illness    67 year old female with a history of lower extremity arterial embolism, Raynaud's disease, paroxysmal atrial fibrillation, PSVT, GERD, attention deficit disorder with hyperactivity, and obesity.  In April 2022, she was admitted with acute right foot pain secondary to acute limb ischemia.  Angiogram showed abrupt occlusion of the right popliteal artery as well as occlusion of all 3 tibial vessels in the proximal segment.  She underwent thrombectomy and balloon angioplasty.  She was subsequently placed on Eliquis.  Event monitoring in June 2022 showed 53 episodes of SVT, the fastest 197 bpm x 13.1 seconds.  Some SVT noted was felt to potentially represent atrial tachycardia versus atrial fibrillation.  In the setting of prior embolism, recommendation was made for continuation of oral anticoagulation.  Echocardiogram in July 2022 showed an EF of 60 to 65% with trivial MR.  She subsequently underwent CTA of the chest abdomen and pelvis and December 2022 which showed no evidence of ventricular or atrial thrombus, or findings of arterial embolic disease within the abdomen or pelvis.  50% stenosis of the proximal  right common iliac artery was noted.  Ms. Sprowl was last seen in cardiology clinic in February 2023.  She has since missed some appointments including a sleep study due to her daughter experiencing an adverse reaction to Plavix with resultant pancreatitis in 6 months and out of the hospital at [facility].  Despite this, she has generally done well from a cardiac standpoint.  In the  early morning hours of February 15, she awoke to use the bathroom and then upon returning to bed, she noted that her heart seem to be pounding hard.  She checked her blood pressure pressure was elevated in the 150s to 160s with heart rates in the 90s, which is high for her.  She then went about her day but throughout the day, noted a very mild vibratory sensation ranging from her lower chest to her umbilicus.  She said it felt as though she swallowed her phone and that it was gently vibrating throughout the day.  She was not having any pain, dyspnea, or recurrence of palpitations.  Due to ongoing laboratory symptoms, her husband brought her to the [facility] emergency department, EKG notable for frequent PVCs.  Chest x-ray and labs including troponin and D-dimer were normal.  She was discharged home.  She has had no recurrence of symptoms today.  Her blood pressure is elevated today however she notes that she did not take her HCTZ.  She also reports today that she has not been taking a beta-blocker since her February 2023 visit.  She does not typically experience palpitations.  She denies PND, orthopnea, dizziness, syncope, or early satiety.  She might experience mild lower extremity swelling if she does not take HCTZ.  Home Medications    Current Outpatient Medications  Medication Sig Dispense Refill   allopurinol (ZYLOPRIM) 300 MG tablet Take 150 mg by mouth daily.     amphetamine-dextroamphetamine (ADDERALL) 20 MG tablet Take 20 mg by mouth daily.     apixaban (ELIQUIS) 5 MG TABS tablet Take 1 tablet (5 mg total) by mouth 2 (two) times daily. 60 tablet 0   aspirin EC 81 MG EC tablet Take 1 tablet (81 mg total) by mouth daily. Swallow whole.     atorvastatin (LIPITOR) 10 MG tablet Take 1 tablet (10 mg total) by mouth daily. 30 tablet 2   hydrochlorothiazide (MICROZIDE) 12.5 MG capsule Take 1 capsule (12.5 mg total) by mouth daily. 90 capsule 3   hydroxychloroquine (PLAQUENIL) 200 MG tablet Take 1 tablet by  mouth daily.     nitroGLYCERIN (NITRO-BID) 2 % ointment Apply topically as needed.     metoprolol tartrate (LOPRESSOR) 25 MG tablet Take 1 tablet (25 mg total) by mouth 2 (two) times daily. (Patient not taking: Reported on 10/06/2022) 180 tablet 3   No current facility-administered medications for this visit.     Review of Systems    Palpitations and vibratory sensation in her lower chest and abdomen as outlined above.  Occasional lower extremity swelling if she is not able to take her HCTZ.  She denies chest pain, dyspnea, PND, orthopnea, dizziness, syncope, or early satiety.  All other systems reviewed and are otherwise negative except as noted above.    Physical Exam    VS:  BP (!) 162/74 (BP Location: Left Arm, Patient Position: Sitting, Cuff Size: Large)   Pulse 83   Ht 5' 3"$  (1.6 m)   Wt 242 lb 6.4 oz (110 kg)   SpO2 99%   BMI 42.94 kg/m  , BMI  Body mass index is 42.94 kg/m. STOP-Bang Score:  7      Vitals:   10/06/22 1426 10/06/22 1537  BP: (!) 162/74 (!) 150/76  Pulse: 83   SpO2: 99%     GEN: Obese, in no acute distress. HEENT: normal. Neck: Supple, obese, difficult to gauge JVP.  No bruits or masses.   Cardiac: RRR, no murmurs, rubs, or gallops. No clubbing, cyanosis, edema.  Radials 2+/PT 2+ and equal bilaterally.  Respiratory:  Respirations regular and unlabored, clear to auscultation bilaterally. GI: Soft, nontender, nondistended, BS + x 4. MS: no deformity or atrophy. Skin: warm and dry, no rash. Neuro:  Strength and sensation are intact. Psych: Normal affect.  Accessory Clinical Findings    ECG from October 05, 2022 ED visit personally reviewed by me today -regular sinus rhythm, 92, frequent PVCs, poor R wave progression- no acute changes.  Lab Results  Component Value Date   WBC 9.1 10/05/2022   HGB 14.0 10/05/2022   HCT 45.2 10/05/2022   MCV 90.4 10/05/2022   PLT 324 10/05/2022   Lab Results  Component Value Date   CREATININE 0.77 10/05/2022    BUN 15 10/05/2022   NA 135 10/05/2022   K 3.6 10/05/2022   CL 101 10/05/2022   CO2 24 10/05/2022   Lab Results  Component Value Date   ALT 25 12/15/2020   AST 22 12/15/2020   ALKPHOS 105 12/15/2020   BILITOT 0.4 12/15/2020   Lab Results  Component Value Date   CHOL 209 (H) 11/18/2012   HDL 48.30 11/18/2012   LDLCALC 115 (H) 11/22/2011   LDLDIRECT 139.9 11/18/2012   TRIG 172.0 (H) 11/18/2012   CHOLHDL 4 11/18/2012     Assessment & Plan    1.  Palpitations/PVCs/PSVT/paroxysmal atrial fibrillation: Patient previously wore an event monitor following embolic event to the right lower extremity in 2022.  Monitoring at that time showed brief runs of PSVT and atrial tachycardia versus atrial fibrillation.  She was previously placed on beta-blocker therapy but has not been taking for some time now as she was confused about which she was post to with beta-blocker after being initiated on hydrochlorothiazide last year.  She awoke in the early morning hours yesterday with a sensation that her heart was pounding and heart rates were in the 90s with elevated blood pressures.  Those symptoms eventually resolved but then she had a vibratory sensation in her chest throughout the remainder of the day prompting her to present to the emergency room last night where ECG showed frequent PVCs.  Workup was otherwise unremarkable with normal troponins.  She has had no recurrent symptoms today.  She is regular on examination today.  I encouraged her to resume taking metoprolol 25 mg twice daily in the setting of recurrent palpitations, prior history of PSVT/atrial arrhythmias, and elevated blood pressure.  I will place a 14-day ZIO XT to assess for recurrent arrhythmias.  Continue Eliquis.  2.  Essential hypertension: Blood pressure elevated on 2 checks today.  She has not been taking beta-blocker and I asked her to resume metoprolol 25 mg twice daily.  Continue HCTZ.  Labs stable yesterday.  3.  Sleep disordered  breathing: Previously seen by pulmonology with recommendation for sleep study however due to her daughter's illness throughout the year last year, she was not able to complete.  She plans to follow-up with pulmonology.  4.  Morbid obesity: Activity is very limited secondary to bilateral knee pain.  She notes that she  eats very little.  She has not discussed this with her primary care provider to potentially consider initiation of a GLP-1 agonist.  5.  Right lower extremity arterial embolus: Status post thrombectomy and balloon angioplasty in 2022.  CTA of the chest, abdomen, and pelvis in late 2022 did not show any evidence of atherosclerosis, plaque, thrombus.  She remains on Eliquis therapy in the setting of possible atrial fibrillation previously noted on monitoring.  6.  Disposition: Follow-up in Zio XT.  Follow-up in clinic in 6 weeks or sooner if necessary.   Murray Hodgkins, NP 10/06/2022, 3:37 PM

## 2022-10-06 NOTE — Patient Instructions (Signed)
Medication Instructions:  Resume taking metoprolol tartrate 25 mg by mouth twice a day  *If you need a refill on your cardiac medications before your next appointment, please call your pharmacy*   Lab Work: No labs ordered  If you have labs (blood work) drawn today and your tests are completely normal, you will receive your results only by: [place] (if you have MyChart) OR A paper copy in the mail If you have any lab test that is abnormal or we need to change your treatment, we will call you to review the results.   Testing/Procedures: Your physician has recommended that you wear a Zio monitor.   This monitor is a medical device that records the heart's electrical activity. Doctors most often use these monitors to diagnose arrhythmias. Arrhythmias are problems with the speed or rhythm of the heartbeat. The monitor is a small device applied to your chest. You can wear one while you do your normal daily activities. While wearing this monitor if you have any symptoms to push the button and record what you felt. Once you have worn this monitor for the period of time provider prescribed (Usually 14 days), you will return the monitor device in the postage paid box. Once it is returned they will download the data collected and provide [place] with a report which the provider will then review and we will call you with those results. Important tips:  Avoid showering during the first 24 hours of wearing the monitor. Avoid excessive sweating to help maximize wear time. Do not submerge the device, no hot tubs, and no swimming pools. Keep any lotions or oils away from the patch. After 24 hours you may shower with the patch on. Take brief showers with your back facing the shower head.  Do not remove patch once it has been placed because that will interrupt data and decrease adhesive wear time. Push the button when you have any symptoms and write down what you were feeling. Once you have completed  wearing your monitor, remove and place into box which has postage paid and place in your outgoing mailbox.  If for some reason you have misplaced your box then call our office and we can provide another box and/or mail it off for you.  Follow-Up: At [facility], you and your health needs are our priority.  As part of our continuing mission to provide you with exceptional heart care, we have created designated Provider Care Teams.  These Care Teams include your primary Cardiologist (physician) and Advanced Practice Providers (APPs -  Physician Assistants and Nurse Practitioners) who all work together to provide you with the care you need, when you need it.  We recommend signing up for the patient portal called "MyChart".  Sign up information is provided on this After Visit Summary.  MyChart is used to connect with patients for Virtual Visits (Telemedicine).  Patients are able to view lab/test results, encounter notes, upcoming appointments, etc.  Non-urgent messages can be sent to your provider as well.   To learn more about what you can do with MyChart, go to [email or web address].    Your next appointment:   6 week(s)  Provider:   You may see Kathlyn Sacramento, MD or one of the following Advanced Practice Providers on your designated Care Team:   Murray Hodgkins, NP  Other Instructions Please contact [facility] to scheduled appointment.

## 2022-10-06 NOTE — Discharge Instructions (Signed)
Your cardiac workup today was reassuring.  No evidence seen on cardiac monitoring other than occasional PVCs.  I recommend follow-up with Dr. Mariea Clonts as scheduled tomorrow.

## 2022-10-07 ENCOUNTER — Other Ambulatory Visit: Payer: Self-pay | Admitting: Nurse Practitioner

## 2022-10-16 ENCOUNTER — Other Ambulatory Visit (INDEPENDENT_AMBULATORY_CARE_PROVIDER_SITE_OTHER): Payer: Self-pay | Admitting: Vascular Surgery

## 2022-10-16 ENCOUNTER — Other Ambulatory Visit: Payer: Self-pay | Admitting: Medical

## 2022-10-16 DIAGNOSIS — Z9889 Other specified postprocedural states: Secondary | ICD-10-CM

## 2022-10-17 ENCOUNTER — Ambulatory Visit (INDEPENDENT_AMBULATORY_CARE_PROVIDER_SITE_OTHER): Payer: Managed Care, Other (non HMO)

## 2022-10-17 ENCOUNTER — Ambulatory Visit (INDEPENDENT_AMBULATORY_CARE_PROVIDER_SITE_OTHER): Payer: Managed Care, Other (non HMO) | Admitting: Vascular Surgery

## 2022-10-17 ENCOUNTER — Encounter (INDEPENDENT_AMBULATORY_CARE_PROVIDER_SITE_OTHER): Payer: Self-pay | Admitting: Vascular Surgery

## 2022-10-17 VITALS — BP 154/79 | HR 77 | Resp 16 | Wt 244.0 lb

## 2022-10-17 DIAGNOSIS — I739 Peripheral vascular disease, unspecified: Secondary | ICD-10-CM

## 2022-10-17 DIAGNOSIS — I83811 Varicose veins of right lower extremities with pain: Secondary | ICD-10-CM

## 2022-10-17 DIAGNOSIS — Z9889 Other specified postprocedural states: Secondary | ICD-10-CM | POA: Diagnosis not present

## 2022-10-17 DIAGNOSIS — I7300 Raynaud's syndrome without gangrene: Secondary | ICD-10-CM | POA: Diagnosis not present

## 2022-10-17 NOTE — Assessment & Plan Note (Signed)
2 years status post revascularization. Her ABIs today were 1.17 on the right and 1.15 on the left with triphasic waveforms.  Digital pressures are reduced more on the right than the left. Cardiac evaluation is unrevealing at this point, I am okay if we reduce the dose of Eliquis to 2.5 mg twice daily.  This can be considered to be removed at some point going forward.  Is possible that her COVID infection and the COVID booster in the weeks leading up to her ischemic event played a major role.  We will go to an annual follow-up with noninvasive studies from our point of view.

## 2022-10-17 NOTE — Progress Notes (Signed)
MRN : [id number]  Jade Edwards is a 67 y.o. (09-17-1955) female who presents with chief complaint of  Chief Complaint  Patient presents with   Follow-up    Ultrasound follow up  .  History of Present Illness: Patient returns today in follow up of right lower extremity arterial insufficiency that was treated for acute limb threatening ischemia of the right lower extremity almost 2 years ago.  She has remained on full dose Eliquis and 81 mg of aspirin since that time.  Her cardiac evaluation showed a very short segment of atrial fibrillation and she had had COVID about a month before both her cardiac evaluation as well as her lower extremity ischemic symptoms.  She also had her COVID booster around the same time.  Both COVID and the booster have been associated with thrombotic issues and this may have played a role.  She has done well since her revascularization.  She does have a chronic history of Raynaud's disease which is stable.  Her ABIs today were 1.17 on the right and 1.15 on the left with triphasic waveforms.  Digital pressures are reduced more on the right than the left.  Current Outpatient Medications  Medication Sig Dispense Refill   allopurinol (ZYLOPRIM) 300 MG tablet Take 150 mg by mouth daily.     amphetamine-dextroamphetamine (ADDERALL) 20 MG tablet Take 20 mg by mouth daily.     apixaban (ELIQUIS) 5 MG TABS tablet Take 1 tablet (5 mg total) by mouth 2 (two) times daily. 60 tablet 0   aspirin EC 81 MG EC tablet Take 1 tablet (81 mg total) by mouth daily. Swallow whole.     atorvastatin (LIPITOR) 10 MG tablet Take 1 tablet (10 mg total) by mouth daily. 30 tablet 2   hydrochlorothiazide (MICROZIDE) 12.5 MG capsule TAKE 1 CAPSULE(12.5 MG) BY MOUTH DAILY 90 capsule 3   hydroxychloroquine (PLAQUENIL) 200 MG tablet Take 1 tablet by mouth daily.     metoprolol tartrate (LOPRESSOR) 25 MG tablet TAKE 1 TABLET(25 MG) BY MOUTH TWICE DAILY 180 tablet 1   nitroGLYCERIN (NITRO-BID) 2 %  ointment Apply topically as needed.     No current facility-administered medications for this visit.    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 11/2020 Angio: 100% R popliteal artery and all 3 tibial vessels in the proximal segment-->s/p thrombectomy & PTA.   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 02/2021 Echo: EF 60-65%, no rwma. Nl RV fxn. Triv MR.   Obesity    Osteoarthritis of right knee    PAF (paroxysmal atrial fibrillation) ([place])    a. 01/2021 Zio: 53 runs SVT w/ ? of afib.  Eliquis started in light of h/o LE embolism.   Primary osteoarthritis of both knees    PSVT (paroxysmal supraventricular tachycardia)    a. 01/2021 Zio: 53 SVT runs. Longest/fastest 13.1 secs @ 197. Some SVT possibly AT vs Afib-->Eliquis started.   Raynaud's disease without gangrene    Rheumatoid factor positive    Stricture and stenosis of esophagus     Past Surgical History:  Procedure Laterality Date  CESAREAN SECTION     ESOPHAGOGASTRODUODENOSCOPY N/A 02/21/2020   Procedure: ESOPHAGOGASTRODUODENOSCOPY (EGD);  Surgeon: Lucilla Lame, MD;  Location: [facility] ENDOSCOPY;  Service: Endoscopy;  Laterality: N/A;   ESOPHAGOGASTRODUODENOSCOPY (EGD) WITH PROPOFOL N/A 04/20/2020   Procedure: ESOPHAGOGASTRODUODENOSCOPY (EGD) WITH PROPOFOL;  Surgeon: Lucilla Lame, MD;  Location: [facility] ENDOSCOPY;  Service: Endoscopy;  Laterality: N/A;   LOWER EXTREMITY ANGIOGRAPHY Right 11/18/2020   Procedure: Lower Extremity Angiography;  Surgeon: Algernon Huxley, MD;  Location: [facility];  Service: Cardiovascular;  Laterality: Right;   VASCULAR SURGERY       Social History   Tobacco Use   Smoking status: Never    Smokeless tobacco: Never  Vaping Use   Vaping Use: Never used  Substance Use Topics   Alcohol use: No   Drug use: No      Family History  Problem Relation Age of Onset   Mental illness Mother    Bipolar disorder Mother    Breast cancer Mother    Depression Mother    Other Mother        DVTs   Lupus Mother    Hyperlipidemia Mother    Rheum arthritis Mother    Stroke Mother    Coronary artery disease Father    Heart attack Father 43   Other Sister        blood clots   Osteoarthritis Daughter    Osteoarthritis Son    Bladder Cancer Neg Hx    Prostate cancer Neg Hx    Kidney cancer Neg Hx      Allergies  Allergen Reactions   Codeine Nausea Only   Epinephrine Palpitations     REVIEW OF SYSTEMS (Negative unless checked)  Constitutional: '[]'$ Weight loss  '[]'$ Fever  '[]'$ Chills Cardiac: '[]'$ Chest pain   '[]'$ Chest pressure   '[]'$ Palpitations   '[]'$ Shortness of breath when laying flat   '[]'$ Shortness of breath at rest   '[]'$ Shortness of breath with exertion. Vascular:  '[]'$ Pain in legs with walking   '[]'$ Pain in legs at rest   '[]'$ Pain in legs when laying flat   '[]'$ Claudication   '[]'$ Pain in feet when walking  '[]'$ Pain in feet at rest  '[]'$ Pain in feet when laying flat   '[]'$ History of DVT   '[]'$ Phlebitis   '[]'$ Swelling in legs   '[]'$ Varicose veins   '[]'$ Non-healing ulcers Pulmonary:   '[]'$ Uses home oxygen   '[]'$ Productive cough   '[]'$ Hemoptysis   '[]'$ Wheeze  '[]'$ COPD   '[]'$ Asthma Neurologic:  '[]'$ Dizziness  '[]'$ Blackouts   '[]'$ Seizures   '[]'$ History of stroke   '[]'$ History of TIA  '[]'$ Aphasia   '[]'$ Temporary blindness   '[]'$ Dysphagia   '[]'$ Weakness or numbness in arms   '[]'$ Weakness or numbness in legs Musculoskeletal:  '[x]'$ Arthritis   '[]'$ Joint swelling   '[x]'$ Joint pain   '[]'$ Low back pain Hematologic:  '[x]'$ Easy bruising  '[]'$ Easy bleeding   '[]'$ Hypercoagulable state   '[]'$ Anemic   Gastrointestinal:  '[]'$ Blood in stool   '[]'$ Vomiting blood  '[]'$ Gastroesophageal reflux/heartburn   '[]'$ Abdominal pain Genitourinary:  '[]'$ Chronic kidney disease   '[]'$ Difficult urination   '[]'$ Frequent urination  '[]'$ Burning with urination   '[]'$ Hematuria Skin:  '[]'$ Rashes   '[]'$ Ulcers   '[]'$ Wounds Psychological:  '[]'$ History of anxiety   '[]'$  History of major depression.  Physical Examination  BP (!) 154/79 (BP Location: Left Arm)   Pulse 77   Resp 16   Wt 244 lb (110.7 kg)   BMI 43.22 kg/m  Gen:  WD/WN, NAD Head: [place]/AT, No temporalis wasting. Ear/Nose/Throat: Hearing grossly intact, nares w/o erythema or drainage  Eyes: Conjunctiva clear. Sclera non-icteric Neck: Supple.  Trachea midline Pulmonary:  Good air movement, no use of accessory muscles.  Cardiac: RRR, no JVD Vascular:  Vessel Right Left  Radial Palpable Palpable                          PT Palpable Palpable  DP Palpable Palpable   Musculoskeletal: M/S 5/5 throughout.  No deformity or atrophy.  Sluggish capillary refill in toes bilaterally more so on the right than the left.  Discoloration of the forefoot bilaterally worse on the right than the left.  No significant lower extremity edema. Neurologic: Sensation grossly intact in extremities.  Symmetrical.  Speech is fluent.  Psychiatric: Judgment intact, Mood & affect appropriate for pt's clinical situation. Dermatologic: No rashes or ulcers noted.  No cellulitis or open wounds.      Labs Recent Results (from the past 2160 hour(s))  Basic metabolic panel     Status: Abnormal   Collection Time: 10/05/22  8:00 PM  Result Value Ref Range   Sodium 135 135 - 145 mmol/L   Potassium 3.6 3.5 - 5.1 mmol/L   Chloride 101 98 - 111 mmol/L   CO2 24 22 - 32 mmol/L   Glucose, Bld 117 (H) 70 - 99 mg/dL    Comment: Glucose reference range applies only to samples taken after fasting for at least 8 hours.   BUN 15 8 - 23 mg/dL   Creatinine, Ser 0.77 0.44 - 1.00 mg/dL   Calcium 8.6 (L) 8.9 - 10.3 mg/dL   GFR, Estimated >60 >60 mL/min    Comment: (NOTE) Calculated using the CKD-EPI Creatinine Equation (2021)    Anion gap 10 5 - 15    Comment: Performed at [facility], [address] [place], [place] [address]  CBC     Status: None   Collection Time: 10/05/22  8:00 PM  Result Value Ref Range   WBC 9.1 4.0 - 10.5 K/uL   RBC 5.00 3.87 - 5.11 MIL/uL   Hemoglobin 14.0 12.0 - 15.0 g/dL   HCT 45.2 36.0 - 46.0 %   MCV 90.4 80.0 - 100.0 fL   MCH 28.0 26.0 - 34.0 pg   MCHC 31.0 30.0 - 36.0 g/dL   RDW 14.5 11.5 - 15.5 %   Platelets 324 150 - 400 K/uL   nRBC 0.0 0.0 - 0.2 %    Comment: Performed at [facility], [place], [place] [address]  Troponin I (High Sensitivity)     Status: None   Collection Time: 10/05/22  8:00 PM  Result Value Ref Range   Troponin I (High Sensitivity) 7 <18 ng/L    Comment: (NOTE) Elevated high sensitivity troponin I (hsTnI) values and significant  changes across serial measurements may suggest ACS but many other  chronic and acute conditions are known to elevate hsTnI results.  Refer to the "Links" section for chest pain algorithms and additional  guidance. Performed at [facility], [address] [place], [place] [address]   Magnesium     Status: None   Collection Time: 10/05/22  8:00 PM  Result Value Ref Range   Magnesium 2.4 1.7 - 2.4 mg/dL    Comment: Performed at [facility], [address] [place], [place] [address]  TSH     Status: None   Collection Time: 10/05/22  8:00 PM  Result Value Ref Range   TSH 1.221 0.350 -  4.500 uIU/mL    Comment: Performed by a 3rd Generation assay with a functional sensitivity of <=0.01 uIU/mL. Performed at [facility], [place], [place] [address]   Troponin I (High Sensitivity)     Status: None   Collection Time: 10/05/22 11:45 PM  Result Value Ref Range   Troponin I (High Sensitivity) 7 <18 ng/L    Comment: (NOTE) Elevated high sensitivity troponin I (hsTnI) values and significant  changes across serial measurements may suggest ACS but many other  chronic and acute conditions are known to  elevate hsTnI results.  Refer to the "Links" section for chest pain algorithms and additional  guidance. Performed at [facility], [address] [place], [place] [address]   D-dimer, quantitative     Status: None   Collection Time: 10/05/22 11:45 PM  Result Value Ref Range   D-Dimer, Quant 0.34 0.00 - 0.50 ug/mL-FEU    Comment: (NOTE) At the manufacturer cut-off value of 0.5 g/mL FEU, this assay has a negative predictive value of 95-100%.This assay is intended for use in conjunction with a clinical pretest probability (PTP) assessment model to exclude pulmonary embolism (PE) and deep venous thrombosis (DVT) in outpatients suspected of PE or DVT. Results should be correlated with clinical presentation. Performed at [facility], [address] [place],  [address]     Radiology DG Chest 2 View  Result Date: 10/05/2022 CLINICAL DATA:  Chest pain since 4 a.m. this morning. EXAM: CHEST - 2 VIEW COMPARISON:  02/21/2020 FINDINGS: The heart size and mediastinal contours are within normal limits. Both lungs are clear. The visualized skeletal structures are unremarkable. IMPRESSION: No active cardiopulmonary disease. Electronically Signed   By: Lucienne Capers M.D.   On: 10/05/2022 20:26    Assessment/Plan Raynaud's disease without gangrene Symptoms stable and long standing.  Contributes to the discoloration and reduced perfusion in the toes.   Varicose veins of right lower extremity with pain Has some venous stasis changes and prominent varicosities on the right leg.  Elevated and compression stockings as needed.  Lower extremity arterial insufficiency, severe, right ([place]) 2 years status post revascularization. Her ABIs today were 1.17 on the right and 1.15 on the left with triphasic waveforms.  Digital pressures are reduced more on the right than the left. Cardiac evaluation is unrevealing at this point, I am okay if we reduce the dose of Eliquis to 2.5 mg  twice daily.  This can be considered to be removed at some point going forward.  Is possible that her COVID infection and the COVID booster in the weeks leading up to her ischemic event played a major role.  We will go to an annual follow-up with noninvasive studies from our point of view.    Leotis Pain, MD  10/17/2022 11:19 AM    This note was created with Dragon medical transcription system.  Any errors from dictation are purely unintentional

## 2022-10-18 DIAGNOSIS — R002 Palpitations: Secondary | ICD-10-CM | POA: Diagnosis not present

## 2022-10-18 LAB — VAS US ABI WITH/WO TBI
Left ABI: 1.15
Right ABI: 1.17

## 2022-11-17 ENCOUNTER — Ambulatory Visit: Payer: Managed Care, Other (non HMO) | Attending: Nurse Practitioner | Admitting: Nurse Practitioner

## 2022-11-17 ENCOUNTER — Encounter: Payer: Self-pay | Admitting: Nurse Practitioner

## 2022-11-17 VITALS — BP 144/68 | HR 73 | Ht 63.0 in | Wt 240.6 lb

## 2022-11-17 DIAGNOSIS — I471 Supraventricular tachycardia, unspecified: Secondary | ICD-10-CM

## 2022-11-17 DIAGNOSIS — Z6841 Body Mass Index (BMI) 40.0 and over, adult: Secondary | ICD-10-CM

## 2022-11-17 DIAGNOSIS — G4730 Sleep apnea, unspecified: Secondary | ICD-10-CM

## 2022-11-17 DIAGNOSIS — R002 Palpitations: Secondary | ICD-10-CM | POA: Diagnosis not present

## 2022-11-17 DIAGNOSIS — I480 Paroxysmal atrial fibrillation: Secondary | ICD-10-CM | POA: Diagnosis not present

## 2022-11-17 DIAGNOSIS — I749 Embolism and thrombosis of unspecified artery: Secondary | ICD-10-CM

## 2022-11-17 DIAGNOSIS — E782 Mixed hyperlipidemia: Secondary | ICD-10-CM

## 2022-11-17 DIAGNOSIS — I493 Ventricular premature depolarization: Secondary | ICD-10-CM

## 2022-11-17 DIAGNOSIS — I10 Essential (primary) hypertension: Secondary | ICD-10-CM

## 2022-11-17 MED ORDER — METOPROLOL TARTRATE 50 MG PO TABS: 50.0000 mg | ORAL_TABLET | Freq: Two times a day (BID) | ORAL | 0 refills | Status: DC

## 2022-11-17 NOTE — Patient Instructions (Signed)
Medication Instructions:  INCREASE metoprolol to 50 mg by mouth twice a day  *If you need a refill on your cardiac medications before your next appointment, please call your pharmacy*  Lab Work: No labs ordered  If you have labs (blood work) drawn today and your tests are completely normal, you will receive your results only by: [place] (if you have MyChart) OR A paper copy in the mail If you have any lab test that is abnormal or we need to change your treatment, we will call you to review the results.   Testing/Procedures: No testing ordered  Follow-Up: At [facility], you and your health needs are our priority.  As part of our continuing mission to provide you with exceptional heart care, we have created designated Provider Care Teams.  These Care Teams include your primary Cardiologist (physician) and Advanced Practice Providers (APPs -  Physician Assistants and Nurse Practitioners) who all work together to provide you with the care you need, when you need it.  We recommend signing up for the patient portal called "MyChart".  Sign up information is provided on this After Visit Summary.  MyChart is used to connect with patients for Virtual Visits (Telemedicine).  Patients are able to view lab/test results, encounter notes, upcoming appointments, etc.  Non-urgent messages can be sent to your provider as well.   To learn more about what you can do with MyChart, go to [email or web address].    Your next appointment:   3 month(s)  Provider:   Kathlyn [place], MD or Murray Hodgkins, NP

## 2022-11-17 NOTE — Progress Notes (Signed)
Office Visit    Patient Name: Jade Edwards Date of Encounter: 11/17/2022  Primary Care Provider:  Sharyne Peach, MD Primary Cardiologist:  Kathlyn Sacramento, MD  Chief Complaint    67 year old female with a history of lower extremity arterial embolism, Raynaud's disease, paroxysmal atrial fibrillation, PSVT, GERD, attention deficit disorder with hyperactivity, and obesity, presents for follow-up related to palpitations.  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 11/2020 Angio: 100% R popliteal artery and all 3 tibial vessels in the proximal segment-->s/p thrombectomy & PTA.   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 02/2021 Echo: EF 60-65%, no rwma. Nl RV fxn. Triv MR.   Obesity    Osteoarthritis of right knee    PAF (paroxysmal atrial fibrillation) ([place])    a. 01/2021 Zio: 53 runs SVT w/ ? of afib.  Eliquis started in light of h/o LE embolism.   Primary osteoarthritis of both knees    PSVT (paroxysmal supraventricular tachycardia)    a. 01/2021 Zio: 53 SVT runs. Longest/fastest 13.1 secs @ 197. Some SVT possibly AT vs Afib-->Eliquis started; b. 09/2022 Zio: Predominantly sinus rhythm-average 71 (47-182).  Slight P wave morphology changes noted.  123 SVT runs-fastest 182 bpm x 4 beats.  Longest 12.5 seconds and 135 bpm.  Frequent PACs and rare PVCs.  No triggered events.   Raynaud's disease without gangrene    Rheumatoid factor positive    Stricture and stenosis of esophagus    Past Surgical History:  Procedure Laterality Date   CESAREAN SECTION     ESOPHAGOGASTRODUODENOSCOPY N/A 02/21/2020   Procedure: ESOPHAGOGASTRODUODENOSCOPY (EGD);   Surgeon: Lucilla Lame, MD;  Location: [facility] ENDOSCOPY;  Service: Endoscopy;  Laterality: N/A;   ESOPHAGOGASTRODUODENOSCOPY (EGD) WITH PROPOFOL N/A 04/20/2020   Procedure: ESOPHAGOGASTRODUODENOSCOPY (EGD) WITH PROPOFOL;  Surgeon: Lucilla Lame, MD;  Location: [facility] ENDOSCOPY;  Service: Endoscopy;  Laterality: N/A;   LOWER EXTREMITY ANGIOGRAPHY Right 11/18/2020   Procedure: Lower Extremity Angiography;  Surgeon: Algernon Huxley, MD;  Location: [facility];  Service: Cardiovascular;  Laterality: Right;   VASCULAR SURGERY      Allergies  Allergies  Allergen Reactions   Codeine Nausea Only   Epinephrine Palpitations    History of Present Illness    67 year old female with above past medical history including lower extremity arterial embolism, Raynaud's disease, paroxysmal atrial fibrillation, PSVT, GERD, attention deficit disorder with hyperactivity, and obesity.  In April 2022, she was admitted with acute right foot pain secondary to acute limb ischemia.  Angiogram showed abrupt occlusion of the right popliteal artery as well as occlusion of all 3 tibial vessels in the proximal segment.  She underwent thrombectomy and balloon angioplasty.  She was subsequently placed on Eliquis.  Event monitoring in June 2022 showed 53 episodes of SVT.  Some SVT was felt to potentially represent atrial tachycardia versus atrial fibrillation in the setting of prior embolism, recommendation was made for continuation of oral anticoagulation.  Echo in July 2022 showed an EF of 60-65% with trivial MR.  She subsequently underwent CTA of the chest, abdomen,  and pelvis in December 2022, which showed no evidence of ventricular or atrial thrombus, or findings of arterial embolic disease within the abdomen or pelvis.  50% stenosis of the proximal right common iliac artery was noted.  Ms. Kluever was last seen in cardiology clinic in February of this year, at which time she reported an episode of tachypalpitations followed by a  vibratory sensation in her chest throughout the following day prompting her to present to the emergency department where she was noted to have frequent PVCs.  Workup was otherwise unremarkable.  I placed a 14-day ZIO monitor, which showed predominantly sinus rhythm at 71 bpm with 123 SVT episodes lasting up to 12-1/2 seconds with the fastest heartbeat of 182 bpm.  She had frequent PACs and rare PVCs.  Slight P wave morphology changes were noted.  No obvious atrial fibrillation was noted.  She follow-up with vascular surgery on February 27 and underwent ABIs, which were normal (1.17 on the right and 1.15 on the left).  Since her last visit, she notes some reduction in palpitations and 'vibratory sensation.'  She only recently started taking her ? blocker twice daily.  She denies chest pain, dyspnea, pnd, orthopnea, n, v, dizziness, syncope, edema, weight gain, or early satiety.   Home Medications    Current Outpatient Medications  Medication Sig Dispense Refill   allopurinol (ZYLOPRIM) 300 MG tablet Take 150 mg by mouth daily.     amphetamine-dextroamphetamine (ADDERALL) 20 MG tablet Take 20 mg by mouth daily.     apixaban (ELIQUIS) 5 MG TABS tablet Take 1 tablet (5 mg total) by mouth 2 (two) times daily. 60 tablet 0   aspirin EC 81 MG EC tablet Take 1 tablet (81 mg total) by mouth daily. Swallow whole.     atorvastatin (LIPITOR) 10 MG tablet Take 1 tablet (10 mg total) by mouth daily. 30 tablet 2   hydrochlorothiazide (MICROZIDE) 12.5 MG capsule TAKE 1 CAPSULE(12.5 MG) BY MOUTH DAILY 90 capsule 3   hydroxychloroquine (PLAQUENIL) 200 MG tablet Take 1 tablet by mouth daily.     metoprolol tartrate (LOPRESSOR) 50 MG tablet Take 1 tablet (50 mg total) by mouth 2 (two) times daily. 180 tablet 0   nitroGLYCERIN (NITRO-BID) 2 % ointment Apply topically as needed.     No current facility-administered medications for this visit.     Review of Systems    Occasional palpitations  She denies chest pain,  dyspnea, pnd, orthopnea, n, v, dizziness, syncope, edema, weight gain, or early satiety.  All other systems reviewed and are otherwise negative except as noted above.    Physical Exam    VS:  BP (!) 144/68   Pulse 73   Ht 5\' 3"  (1.6 m)   Wt 240 lb 9.6 oz (109.1 kg)   SpO2 98%   BMI 42.62 kg/m  , BMI Body mass index is 42.62 kg/m. STOP-Bang Score:  7      Vitals:   11/17/22 1038 11/17/22 1311  BP: (!) 142/70 (!) 144/68  Pulse: 73   SpO2: 98%     GEN: Well nourished, well developed, in no acute distress. HEENT: normal. Neck: Supple, no bruits or masses.  Difficult to gauge JVP. Cardiac: RRR, no murmurs, rubs, or gallops. No clubbing, cyanosis, edema.  Radials 2+/PT 2+ and equal bilaterally.  Respiratory:  Respirations regular and unlabored, clear to auscultation bilaterally. GI: Soft, nontender, nondistended, BS + x 4. MS: no deformity or atrophy. Skin: warm and dry, no rash. Neuro:  Strength and sensation are intact. Psych: Normal affect.  Accessory Clinical Findings    ECG personally reviewed by me today -regular sinus rhythm, 73- no acute changes.  Lab Results  Component Value Date   WBC 9.1 10/05/2022   HGB 14.0 10/05/2022   HCT 45.2 10/05/2022   MCV 90.4 10/05/2022   PLT 324 10/05/2022   Lab Results  Component Value Date   CREATININE 0.77 10/05/2022   BUN 15 10/05/2022   NA 135 10/05/2022   K 3.6 10/05/2022   CL 101 10/05/2022   CO2 24 10/05/2022   Lab Results  Component Value Date   ALT 25 12/15/2020   AST 22 12/15/2020   ALKPHOS 105 12/15/2020   BILITOT 0.4 12/15/2020   Lab Results  Component Value Date   CHOL 209 (H) 11/18/2012   HDL 48.30 11/18/2012   LDLCALC 115 (H) 11/22/2011   LDLDIRECT 139.9 11/18/2012   TRIG 172.0 (H) 11/18/2012   CHOLHDL 4 11/18/2012     Assessment & Plan    1.  Palpitations/PSVT/PACs and PVCs: Patient recently evaluated secondary to complaints of palpitations and a vibratory sensation in her chest.  Monitoring  showed predominantly sinus rhythm with 123 episodes of SVT, longest lasting 12.5 seconds with a maximum rate of 182 bpm.  She only triggered 1 event without any significant arrhythmia.  She continues to note vibratory sensations in her chest though no frank tachypalpitations.  Blood pressure is elevated today.  In light of findings of elevated blood pressure, I have increased her metoprolol to 50 mg twice daily.  2.  Paroxysmal atrial fibrillation: Previous monitoring 2022, which she wore following embolic event to the right lower extremity showed brief runs of PSVT and atrial tachycardia versus atrial fibrillation.  In the setting of prior embolic event, she was placed on Eliquis 5 mg twice daily at that time.  No recurrent A-fib on recent monitoring.  She recently had a discussion with her vascular surgeon regarding potentially dropping Eliquis dose to 2.5 mg twice daily secondary to bruising.  In light of presumed paroxysmal atrial fibrillation, would recommend continuing at the therapeutic dose of 5 mg twice daily.   3.  Sleep disordered breathing: She plans to follow-up with pulmonology to reschedule sleep study.  4.  Morbid obesity: Activity limited secondary to bilateral knee pain.  Consider GLP-1 agonist-defer to primary care.  5.  Essential hypertension: Blood pressure elevated today.  Titrating beta-blocker.  She is also on HCTZ therapy.  6.  Right lower extremity arterial embolus: Recent normal ABIs.  Followed by vascular surgery.  Continue Eliquis.  She wishes to come off of aspirin-defer to vascular surgery.  7.  Hyperlipidemia: Followed by primary care.  She remains on statin therapy.  8.  Disposition: Follow-up in clinic in 3 months or sooner if necessary.   Murray Hodgkins, NP 11/17/2022, 1:35 PM

## 2023-02-16 ENCOUNTER — Other Ambulatory Visit: Payer: Self-pay | Admitting: Nurse Practitioner

## 2023-02-16 ENCOUNTER — Other Ambulatory Visit: Payer: Self-pay | Admitting: Cardiovascular Disease

## 2023-02-27 ENCOUNTER — Ambulatory Visit: Payer: Medicare HMO | Admitting: Cardiovascular Disease

## 2023-04-18 ENCOUNTER — Ambulatory Visit: Payer: Medicare HMO | Attending: Cardiovascular Disease | Admitting: Nurse Practitioner

## 2023-04-18 ENCOUNTER — Encounter: Payer: Self-pay | Admitting: Nurse Practitioner

## 2023-04-18 VITALS — BP 136/78 | Ht 63.0 in | Wt 225.8 lb

## 2023-04-18 DIAGNOSIS — I749 Embolism and thrombosis of unspecified artery: Secondary | ICD-10-CM

## 2023-04-18 DIAGNOSIS — I471 Supraventricular tachycardia, unspecified: Secondary | ICD-10-CM | POA: Diagnosis not present

## 2023-04-18 DIAGNOSIS — E782 Mixed hyperlipidemia: Secondary | ICD-10-CM

## 2023-04-18 DIAGNOSIS — Z6841 Body Mass Index (BMI) 40.0 and over, adult: Secondary | ICD-10-CM

## 2023-04-18 DIAGNOSIS — I493 Ventricular premature depolarization: Secondary | ICD-10-CM

## 2023-04-18 DIAGNOSIS — R002 Palpitations: Secondary | ICD-10-CM

## 2023-04-18 DIAGNOSIS — I480 Paroxysmal atrial fibrillation: Secondary | ICD-10-CM

## 2023-04-18 DIAGNOSIS — G4730 Sleep apnea, unspecified: Secondary | ICD-10-CM

## 2023-04-18 DIAGNOSIS — I10 Essential (primary) hypertension: Secondary | ICD-10-CM

## 2023-04-18 NOTE — Progress Notes (Signed)
Office Visit    Patient Name: Jade Edwards Date of Encounter: 04/18/2023  Primary Care Provider:  Rayetta Humphrey, MD Primary Cardiologist:  Lorine Bears, MD  Chief Complaint    67 y.o. female with a history of lower extremity arterial embolism, Raynaud's disease, paroxysmal atrial fibrillation, PSVT, GERD, attention deficit disorder with hyperactivity, and obesity, who presents for f/u related to palpitations.  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 11/2020 Angio: 100% R popliteal artery and all 3 tibial vessels in the proximal segment-->s/p thrombectomy & PTA.   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 02/2021 Echo: EF 60-65%, no rwma. Nl RV fxn. Triv MR.   Obesity    Osteoarthritis of right knee    PAF (paroxysmal atrial fibrillation) ([facility])    a. 01/2021 Zio: 53 runs SVT w/ ? of afib.  Eliquis started in light of h/o LE embolism.   Primary osteoarthritis of both knees    PSVT (paroxysmal supraventricular tachycardia)    a. 01/2021 Zio: 53 SVT runs. Longest/fastest 13.1 secs @ 197. Some SVT possibly AT vs Afib-->Eliquis started; b. 09/2022 Zio: Predominantly sinus rhythm-average 71 (47-182).  Slight P wave morphology changes noted.  123 SVT runs-fastest 182 bpm x 4 beats.  Longest 12.5 seconds and 135 bpm.  Frequent PACs and rare PVCs.  No triggered events.   Raynaud's disease without gangrene    Rheumatoid factor positive    Stricture and stenosis of esophagus    Past Surgical History:  Procedure Laterality Date   CESAREAN SECTION     ESOPHAGOGASTRODUODENOSCOPY N/A 02/21/2020   Procedure: ESOPHAGOGASTRODUODENOSCOPY (EGD);   Surgeon: Midge Minium, MD;  Location: [facility] ENDOSCOPY;  Service: Endoscopy;  Laterality: N/A;   ESOPHAGOGASTRODUODENOSCOPY (EGD) WITH PROPOFOL N/A 04/20/2020   Procedure: ESOPHAGOGASTRODUODENOSCOPY (EGD) WITH PROPOFOL;  Surgeon: Midge Minium, MD;  Location: [facility] ENDOSCOPY;  Service: Endoscopy;  Laterality: N/A;   LOWER EXTREMITY ANGIOGRAPHY Right 11/18/2020   Procedure: Lower Extremity Angiography;  Surgeon: Annice Needy, MD;  Location: [facility] INVASIVE CV LAB;  Service: Cardiovascular;  Laterality: Right;   VASCULAR SURGERY      Allergies  Allergies  Allergen Reactions   Codeine Nausea Only   Epinephrine Palpitations    History of Present Illness      67 y.o. y/o female with above past medical history including lower extremity arterial embolism, Raynaud's disease, paroxysmal atrial fibrillation, PSVT, GERD, attention deficit disorder with hyperactivity, and obesity.  In April 2022, she was admitted with acute right foot pain secondary to acute limb ischemia.  Angiogram showed abrupt occlusion of the right popliteal artery as well as occlusion of all 3 tibial vessels in the proximal segment.  She underwent thrombectomy and balloon angioplasty.  She was subsequently placed on Eliquis.  Event monitoring in June 2022 showed 53 episodes of SVT.  Some SVT was felt to potentially represent atrial tachycardia versus atrial fibrillation in the setting of prior embolism, recommendation was made for continuation of oral anticoagulation.  Echo in July 2022 showed an EF of 60-65% with trivial MR.  She subsequently  underwent CTA of the chest, abdomen, and pelvis in December 2022, which showed no evidence of ventricular or atrial thrombus, or findings of arterial embolic disease within the abdomen or pelvis.  50% stenosis of the proximal right common iliac artery was noted.   14-day ZIO monitor in early 2024 in the setting of palpitations and vibratory sensation in her chest, showed predominantly sinus rhythm with  123 episodes of PSVT lasting up to 12.5 seconds with the fastest heart rate of 182 bpm.  Frequent PACs and PVCs were also noted.  No obvious atrial fibrillation.  Symptoms improved with titration of beta-blocker therapy.   Since her last visit, Jade Edwards, has done well.  She has changed her diet and activity levels, and has lost 15 pounds in the past 3 months.  She is hoping to continue to lose about 5 pounds per month.  She occasionally notes a brief flutter with palpitations but no sustained issues.  She denies chest pain, dyspnea, PND, orthopnea, dizziness, syncope, edema, or early satiety.  Exercise limited to what she can do in a chair in the setting of chronic knee pain.  Home Medications    Current Outpatient Medications  Medication Sig Dispense Refill   allopurinol (ZYLOPRIM) 300 MG tablet Take 150 mg by mouth daily.     amphetamine-dextroamphetamine (ADDERALL) 20 MG tablet Take 20 mg by mouth daily.     apixaban (ELIQUIS) 5 MG TABS tablet Take 1 tablet (5 mg total) by mouth 2 (two) times daily. 60 tablet 0   atorvastatin (LIPITOR) 10 MG tablet Take 1 tablet (10 mg total) by mouth daily. 30 tablet 2   hydrochlorothiazide (MICROZIDE) 12.5 MG capsule TAKE 1 CAPSULE(12.5 MG) BY MOUTH DAILY 90 capsule 3   hydroxychloroquine (PLAQUENIL) 200 MG tablet Take 1 tablet by mouth daily.     metoprolol tartrate (LOPRESSOR) 50 MG tablet TAKE 1 TABLET(50 MG) BY MOUTH TWICE DAILY 180 tablet 0   nitroGLYCERIN (NITRO-BID) 2 % ointment Apply topically as needed.     aspirin EC 81 MG EC tablet Take 1 tablet (81 mg total) by mouth daily. Swallow whole. (Patient not taking: Reported on 04/18/2023)     No current facility-administered medications for this visit.     Review of Systems    Occasional brief flutter/palpitation.  She denies chest pain, dyspnea, PND, orthopnea, dizziness, syncope, edema, or early satiety.  All other systems reviewed and are otherwise negative except as noted above.    Physical  Exam    VS:  BP 136/78 (BP Location: Left Arm, Patient Position: Sitting, Cuff Size: Large)   Ht 5\' 3"  (1.6 m)   Wt 225 lb 12.8 oz (102.4 kg)   SpO2 99%   BMI 40.00 kg/m  , BMI Body mass index is 40 kg/m. GEN: Well nourished, well developed, in no acute distress. HEENT: normal. Neck: Supple, no JVD, carotid bruits, or masses. Cardiac: RRR, no murmurs, rubs, or gallops. No clubbing, cyanosis, edema.  Radials 2+/PT 2+ and equal bilaterally.  Respiratory:  Respirations regular and unlabored, clear to auscultation bilaterally. GI: Soft, nontender, nondistended, BS + x 4. MS: no deformity or atrophy. Skin: warm and dry, no rash. Neuro:  Strength and sensation are intact. Psych: Normal affect.  Accessory Clinical Findings    ECG personally reviewed by me today - EKG Interpretation Date/Time:  Wednesday April 18 2023 09:59:33 EDT Ventricular Rate:  57 PR Interval:  174 QRS Duration:  66 QT Interval:  408 QTC Calculation: 397 R Axis:   -  27  Text Interpretation: Sinus bradycardia Inferior infarct , age undetermined Possible Anterolateral infarct (cited on or before 18-Nov-2020) Confirmed by Nicolasa Ducking [phone]) on 04/18/2023 10:21:53 AM   - no acute changes.  Labs dated Dec 26, 2022 from [facility]: Hemoglobin 14.2, hematocrit 45.7, WBC 7.7, platelets 305 Sodium 138, potassium 3.9, chloride 101, CO2 24, BUN 9, creatinine 0.9, glucose 108 Calcium 8.8  Labs dated October 11, 2022 from [facility]: Hemoglobin A1c 6.1  Labs dated Dec 22, 2021 from [facility]:  Total cholesterol 178, triglycerides 130, HDL 66, LDL 86.  Assessment & Plan    1.  Palpitations/PSVT/PACs PVCs: Monitoring earlier this year showed predominantly sinus rhythm with 124 episodes of SVT lasting up to 2.5 seconds at a rate of 182 bpm.  Metoprolol titrate to 50 mg twice daily at that time and since then, she notes only an occasional brief flutter in her chest.  Overall significantly improved.   Continue current dose of metoprolol.  2.  Paroxysmal atrial fibrillation: Previous monitoring 2022, which she wore following anabolic event to the right lower extremity showed appearance of PSVT and atrial tachycardia versus atrial fibrillation.  In the setting of parabolic event, she has remained on Eliquis 5 mg twice daily.  Stable labs in May.  Continue beta-blocker.  3.  Sleep disordered breathing: STOP-BANG of 7.  Previously referred to pulmonology who patient is here to see.  She is actively trying to lose weight.  4.  Morbid obesity: Significantly altered her diet and activity and is down 15 pounds in the past 3 months.  She is also continue to lose 5 pounds per month.  I congratulated on her success so far and encouraged her to keep up the good work.  5.  Right lower extremity arterial embolus: Follow-up with vascular surgery.  Continue Eliquis.  6.  Essential hypertension: Blood pressure stable on beta-blocker and diuretic therapy.  7.  Hyperlipidemia: She remains on atorvastatin therapy with an LDL of 86 in May.  Ongoing lifestyle modifications encouraged.  8.  Disposition: Follow-up in 6 months or sooner if necessary.  Nicolasa Ducking, NP 04/18/2023, 10:35 AM

## 2023-04-18 NOTE — Patient Instructions (Signed)
Medication Instructions:  Your Physician recommend you continue on your current medication as directed.    *If you need a refill on your cardiac medications before your next appointment, please call your pharmacy*   Lab Work: NONE  If you have labs (blood work) drawn today and your tests are completely normal, you will receive your results only by: MyChart Message (if you have MyChart) OR A paper copy in the mail If you have any lab test that is abnormal or we need to change your treatment, we will call you to review the results.   Testing/Procedures: None   Follow-Up: At [facility], you and your health needs are our priority.  As part of our continuing mission to provide you with exceptional heart care, we have created designated Provider Care Teams.  These Care Teams include your primary Cardiologist (physician) and Advanced Practice Providers (APPs -  Physician Assistants and Nurse Practitioners) who all work together to provide you with the care you need, when you need it.  We recommend signing up for the patient portal called "MyChart".  Sign up information is provided on this After Visit Summary.  MyChart is used to connect with patients for Virtual Visits (Telemedicine).  Patients are able to view lab/test results, encounter notes, upcoming appointments, etc.  Non-urgent messages can be sent to your provider as well.   To learn more about what you can do with MyChart, go to [email or web address].    Your next appointment:   6 month(s)  Provider:   You may see Lorine Bears, MD or one of the following Advanced Practice Providers on your designated Care Team:

## 2023-06-21 LAB — COLOGUARD: COLOGUARD: NEGATIVE

## 2023-06-21 LAB — EXTERNAL GENERIC LAB PROCEDURE: COLOGUARD: NEGATIVE

## 2023-10-18 ENCOUNTER — Other Ambulatory Visit (INDEPENDENT_AMBULATORY_CARE_PROVIDER_SITE_OTHER): Payer: Self-pay | Admitting: Vascular Surgery

## 2023-10-18 DIAGNOSIS — Z9889 Other specified postprocedural states: Secondary | ICD-10-CM

## 2023-10-19 ENCOUNTER — Encounter (INDEPENDENT_AMBULATORY_CARE_PROVIDER_SITE_OTHER): Payer: Managed Care, Other (non HMO)

## 2023-10-19 ENCOUNTER — Ambulatory Visit (INDEPENDENT_AMBULATORY_CARE_PROVIDER_SITE_OTHER): Payer: Medicare HMO | Admitting: Vascular Surgery

## 2023-11-12 ENCOUNTER — Other Ambulatory Visit: Payer: Self-pay | Admitting: Nurse Practitioner

## 2024-01-08 ENCOUNTER — Encounter (INDEPENDENT_AMBULATORY_CARE_PROVIDER_SITE_OTHER): Payer: Self-pay

## 2024-02-26 NOTE — Progress Notes (Unsigned)
 Cardiology Office Note   Date:  02/27/2024  ID:  Jade Edwards, DOB Jan 29, 1956, MRN [id number] PCP: Zachary Idelia LABOR, MD  [facility] Providers Cardiologist:  Deatrice Cage, MD     History of Present Illness Jade Edwards is a 68 y.o. female with a past medical history of lower extremity arterial embolism, Raynaud's disease, paroxysmal atrial fibrillation, PSVT, GERD, hypertension, hyperlipidemia, attention deficit disorder with hyperactivity, and obesity, who presents for follow-up.   Patient presented in April 2022 after being admitted with acute right foot pain secondary to acute limb ischemia.  Angiogram showed abrupt occlusion of the right popliteal artery as well as occlusion of all 3 tibial vessels in the proximal segment.  She underwent thrombectomy and balloon angioplasty was subsequently placed on apixaban .  Event monitoring in June 2022 showed 53 episodes of SVT.  Some SVT was felt to potentially represent atrial tachycardia versus atrial fibrillation in the setting of prior embolism.  The recommendation was made for continuation of oral anticoagulation.  Echocardiogram completed July 2022 showed an EF of 60 to 65% with trivial MR.  She subsequently underwent CTA of the chest, abdomen, and pelvis in December 2022 which showed no significant evidence of ventricular or atrial thrombus or findings of arterial inflow disease within the abdomen or pelvis.  50% stenosis of the proximal right common iliac artery was noted.  She wore a 14-day ZIO monitor in early 2024 for the setting of palpitations and vibratory sensation in her chest, which showed predominantly sinus rhythm 123 episodes of PSVT the longest up to 12.5 seconds with the fastest heart rate of 182 bpm.  Frequent PACs and PVCs were also noted.  No obvious atrial fibrillation.  Symptoms improved with titration of beta-blocker therapy.   She was last seen in clinic 04/18/2023.  At that time she was doing well from a  cardiac perspective.  She changed her diet and activity levels and lost approximately 15 pounds in the past 3 months.  She was noted to continue to lose about 5 pounds per month.  She occasionally notices brief fluttering with palpitations but no sustained issues.  There were no medication changes have remained under further testing that was ordered.  She returns to clinic today accompanied by family member.  She states that overall she has been doing well from a cardiac perspective.  She has noted worsening fatigue since she had COVID.  She also notes occasionally she has a tachycardic.  She states that she will eventually need to have knee replacement completed and she has put on weight.  Her daughter recently had an open heart surgery and has had several complications along the way.  She states that she has been compliant with her current medication regimen without any undue side effects.  She states that she has not missed any doses of her apixaban  and denies any bleeding with no blood noted in her stool or urine.  States that previously she had been referred to pulmonary and had a sleep study scheduled that had to be canceled and follow-up with pulmonary since that time.  Denies any recent hospitalizations or visits to the emergency department.  ROS: 10 point review of system has been reviewed and considered negative except was been listed in the HPI  Studies Reviewed EKG Interpretation Date/Time:  Wednesday February 27 2024 09:10:02 EDT Ventricular Rate:  61 PR Interval:  176 QRS Duration:  68 QT Interval:  418 QTC Calculation: 420 R Axis:   -12  Text Interpretation: Sinus rhythm with Premature atrial complexes Possible Anterior infarct (cited on or before 18-Nov-2020) When compared with ECG of 18-Apr-2023 09:59, No significant change since last tracing Confirmed by Gerard Frederick ([id number]) on 02/27/2024 9:22:01 AM    Zio XT 11/08/2022 Patient had a min HR of 47 bpm, max HR of 182 bpm, and avg HR of  71 bpm. Predominant underlying rhythm was Sinus Rhythm. Slight P wave morphology changes were noted.  123 Supraventricular Tachycardia runs occurred, the run with the fastest interval lasting 4 beats with a max rate of 182 bpm, the longest lasting 12.5 secs with an avg rate of 135 bpm.  Frequent PACs and rare PVCs.  2D echo 02/22/2021 1. Left ventricular ejection fraction, by estimation, is 60 to 65%. The  left ventricle has normal function. The left ventricle has no regional  wall motion abnormalities. Left ventricular diastolic parameters were  normal.   2. Right ventricular systolic function is normal. The right ventricular  size is normal. Tricuspid regurgitation signal is inadequate for assessing  PA pressure.   3. The mitral valve is normal in structure. Trivial mitral valve  regurgitation. No evidence of mitral stenosis.   4. The aortic valve is tricuspid. Aortic valve regurgitation is not  visualized. No aortic stenosis is present.   5. The inferior vena cava is normal in size with greater than 50%  respiratory variability, suggesting right atrial pressure of 3 mmHg.  Risk Assessment/Calculations  CHA2DS2-VASc Score = 6   This indicates a 9.7% annual risk of stroke. The patient's score is based upon: CHF History: 1 HTN History: 0 Diabetes History: 1 Stroke History: 2 Vascular Disease History: 0 Age Score: 1 Gender Score: 1        STOP-Bang Score:         Click here for STOP-BANG Calculator   :1}     10/06/2022    3:36 PM  STOP-BANG  Do you snore loudly? Yes  Do you often feel tired, fatigued, or sleepy during the daytime? Yes  Has anyone observed you stop breathing during sleep? Yes  Do you have (or are you being treated for) high blood pressure? Yes  Recent BMI (Calculated) 42.95  Is BMI greater than 35 kg/m2? 1=Yes  Age older than 66 years old? 1=Yes  Has large neck size > 40 cm (15.7 in, large female shirt size, large female collar size > 16) Yes  Gender - Female 0=No   STOP-Bang Total Score 7      If STOP-BANG Score >=3 OR two clinical symptoms - patient qualifies for WatchPAT (CPT 95800)      Sleep study ordered due to two (2) of the following clinical symptoms/diagnoses:  Excessive daytime sleepiness G47.10  Gastroesophageal reflux K21.9  Nocturia R35.1  Morning Headaches G44.221  Difficulty concentrating R41.840  Memory problems or poor judgment G31.84  Personality changes or irritability R45.4  Loud snoring R06.83  Depression F32.9  Unrefreshed by sleep G47.8  Impotence N52.9  History of high blood pressure R03.0  Insomnia G47.00  Sleep Disordered Breathing or Sleep Apnea ICD G47.33     Physical Exam VS:  BP 132/78 (BP Location: Left Arm, Patient Position: Sitting, Cuff Size: Normal)   Pulse 61   Ht 5' 2 (1.575 m)   Wt 234 lb 3.2 oz (106.2 kg)   SpO2 100%   BMI 42.84 kg/m        Wt Readings from Last 3 Encounters:  02/27/24 234 lb 3.2 oz (106.2 kg)  04/18/23 225 lb 12.8 oz (102.4 kg)  11/17/22 240 lb 9.6 oz (109.1 kg)    GEN: Well nourished, well developed in no acute distress NECK: No JVD; No carotid bruits CARDIAC: RRR, no murmurs, rubs, gallops RESPIRATORY:  Clear to auscultation without rales, wheezing or rhonchi  ABDOMEN: Soft, non-tender, non-distended EXTREMITIES: Trace pretibial edema; No deformity   ASSESSMENT AND PLAN Palpitations/ PSVT/ PACs/PVCs previous monitor showed predominantly sinus rhythm with 121 episodes of SVT lasting up to 2.5 seconds at a rate of 182 bpm.  At that time she was initiated on metoprolol  titrate 50 mg twice daily.  Since that time she has occasional bumps in her heart rate and occasional palpitations but overall significantly improved.  She is continued on metoprolol  tartrate 50 mg twice daily.  EKG today reveals sinus rhythm with occasional PACs with questionable old anterior infarct but no significant change from prior studies.  Paroxysmal atrial fibrillation which she has been  maintaining sinus rhythm.  In the setting of a parabolic event, she has remained on apixaban  5 mg twice daily.  Labs have remained stable and she has continued on beta-blocker therapy.  Sleep disordered breathing with patient states she has never been told she potentially may have sleep apnea but she does note on occasion that she does snore but she has increased amount of fatigue if this is that she has not rested.  She has a STOP-BANG of 7.  Previously was referred to pulmonary but was unable to keep active appointments due to health concerns of her daughter.  At this time she is unable to do an in-clinic sleep study and we will set her up for a WatchPAT.  She has been advised that if they are unable to treat her based off WatchPAT and she may still require an in lab study.  Primary hypertension with a blood pressure today 130/90 and repeat 132/78.  She has been continued on HCTZ 12.5 mg daily metoprolol  tartrate 50 mg twice daily.  She has been encouraged to continue to monitor pressures 1 to 2 hours postmedication administration.   Hyperlipidemia with last LDL 105, goal is less than 100.  She is currently continued on atorvastatin  10 mg daily with ongoing management per PCP.  Right lower extremity arterial embolus she is continued on apixaban  5 mg twice daily.  Ongoing management per [facility].  Morbid obesity with a BMI of 42.84.  Her mobility is limited due to knee discomfort which she would likely have to have a knee replacement that is upcoming.  This has not been scheduled at this time.  She has been encouraged to try to continue to work on her weight loss that she had previously lost 15 pounds.  She states that with the stress and lack of ability she has gained weight but does not eat.       Dispo: Patient to return to clinic to see MD/APP in 3 months or sooner if needed for further evaluation.  Signed, Rembert Browe, NP

## 2024-02-27 ENCOUNTER — Telehealth: Payer: Self-pay

## 2024-02-27 ENCOUNTER — Encounter: Payer: Self-pay | Admitting: Cardiology

## 2024-02-27 ENCOUNTER — Ambulatory Visit: Attending: Cardiology | Admitting: Cardiology

## 2024-02-27 VITALS — BP 132/78 | HR 61 | Ht 62.0 in | Wt 234.2 lb

## 2024-02-27 DIAGNOSIS — E782 Mixed hyperlipidemia: Secondary | ICD-10-CM

## 2024-02-27 DIAGNOSIS — I10 Essential (primary) hypertension: Secondary | ICD-10-CM

## 2024-02-27 DIAGNOSIS — I480 Paroxysmal atrial fibrillation: Secondary | ICD-10-CM

## 2024-02-27 DIAGNOSIS — Z9189 Other specified personal risk factors, not elsewhere classified: Secondary | ICD-10-CM

## 2024-02-27 DIAGNOSIS — G4730 Sleep apnea, unspecified: Secondary | ICD-10-CM

## 2024-02-27 DIAGNOSIS — I471 Supraventricular tachycardia, unspecified: Secondary | ICD-10-CM | POA: Diagnosis not present

## 2024-02-27 DIAGNOSIS — Z6841 Body Mass Index (BMI) 40.0 and over, adult: Secondary | ICD-10-CM

## 2024-02-27 DIAGNOSIS — I749 Embolism and thrombosis of unspecified artery: Secondary | ICD-10-CM

## 2024-02-27 DIAGNOSIS — R002 Palpitations: Secondary | ICD-10-CM | POA: Diagnosis not present

## 2024-02-27 NOTE — Patient Instructions (Signed)
 Medication Instructions:  Your physician recommends that you continue on your current medications as directed. Please refer to the Current Medication list given to you today.   *If you need a refill on your cardiac medications before your next appointment, please call your pharmacy*  Lab Work: No labs ordered today  If you have labs (blood work) drawn today and your tests are completely normal, you will receive your results only by: MyChart Message (if you have MyChart) OR A paper copy in the mail If you have any lab test that is abnormal or we need to change your treatment, we will call you to review the results.  Testing/Procedures: WatchPAT?  Is a FDA cleared portable home sleep study test that uses a watch and 3 points of contact to monitor 7 different channels, including your heart rate, oxygen saturations, body position, snoring, and chest motion.  The study is easy to use from the comfort of your own home and accurately detect sleep apnea.  Before bed, you attach the chest sensor, attached the sleep apnea bracelet to your nondominant hand, and attach the finger probe.  After the study, the raw data is downloaded from the watch and scored for apnea events.   For more information: [email or web address]  Patient Testing Instructions:  Do not put battery into the device until bedtime when you are ready to begin the test. Please call the support number if you need assistance after following the instructions below: 24 hour support line- [phone] or [facility] support at [phone] (option 2)  Download the [facility] WatchPAT One app through the google play store or App Store  Be sure to turn on or enable access to bluetooth in settlings on your smartphone/ device  Make sure no other bluetooth devices are on and within the vicinity of your smartphone/ device and WatchPAT watch during testing.  Make sure to leave your smart phone/ device plugged in and charging all night.   When ready for bed:  Follow the instructions step by step in the WatchPAT One App to activate the testing device. For additional instructions, including video instruction, visit the WatchPAT One video on Youtube. You can search for WatchPat One within Youtube (video is 4 minutes and 18 seconds) or enter: https://youtube/[email or web address]?[email or web address]_[email or web address] Please note: You will be prompted to enter a Pin to connect via bluetooth when starting the test. The PIN will be assigned to you when you receive the test.  The device is disposable, but it recommended that you retain the device until you receive a call letting you know the study has been received and the results have been interpreted.  We will let you know if the study did not transmit to us  properly after the test is completed. You do not need to call us  to confirm the receipt of the test.  Please complete the test within 48 hours of receiving PIN.   Frequently Asked Questions:  What is Watch Bruna one?  A single use fully disposable home sleep apnea testing device and will not need to be returned after completion.  What are the requirements to use WatchPAT one?  The be able to have a successful watchpat one sleep study, you should have your Watch pat one device, your smart phone, watch pat one app, your PIN number and Internet access What type of phone do I need?  You should have a smart phone that uses Android 5.1 and above or any Iphone with IOS 10 and above How can I download the WatchPAT one  app?  Based on your device type search for WatchPAT one app either in google play for android devices or APP store for Iphone's Where will I get my PIN for the study?  Your PIN will be provided by your physician's office. It is used for authentication and if you lose/forget your PIN, please reach out to your providers office.  I do not have Internet at home. Can I do WatchPAT one study?  WatchPAT One needs Internet connection throughout the night to be able to  transmit the sleep data. You can use your home/local internet or your cellular's data package. However, it is always recommended to use home/local Internet. It is estimated that between 20MB-30MB will be used with each study.However, the application will be looking for space in the phone to start the study.  What happens if I lose internet or bluetooth connection?  During the internet disconnection, your phone will not be able to transmit the sleep data. All the data, will be stored in your phone. As soon as the internet connection is back on, the phone will being sending the sleep data. During the bluetooth disconnection, WatchPAT one will not be able to to send the sleep data to your phone. Data will be kept in the WatchPAT one until two devices have bluetooth connection back on. As soon as the connection is back on, WatchPAT one will send the sleep data to the phone.  How long do I need to wear the WatchPAT one?  After you start the study, you should wear the device at least 6 hours.  How far should I keep my phone from the device?  During the night, your phone should be within 15 feet.  What happens if I leave the room for restroom or other reasons?  Leaving the room for any reason will not cause any problem. As soon as your get back to the room, both devices will reconnect and will continue to send the sleep data. Can I use my phone during the sleep study?  Yes, you can use your phone as usual during the study. But it is recommended to put your watchpat one on when you are ready to go to bed.  How will I get my study results?  A soon as you completed your study, your sleep data will be sent to the provider. They will then share the results with you when they are ready.    Follow-Up: At [facility], you and your health needs are our priority.  As part of our continuing mission to provide you with exceptional heart care, our providers are all part of one team.  This team includes  your primary Cardiologist (physician) and Advanced Practice Providers or APPs (Physician Assistants and Nurse Practitioners) who all work together to provide you with the care you need, when you need it.  Your next appointment:   3 month(s)  Provider:   Tylene Lunch, NP

## 2024-02-27 NOTE — Telephone Encounter (Signed)
 Ordering provider: SHERI  HAMMOCK Associated diagnoses: Z91.89 WatchPAT PA obtained on 02/27/2024 by Joshua Dalton Seip, CMA. Authorization: No; tracking ID NO PA REQ Patient notified of PIN ([id number]) on 02/27/2024 via Notification Method: phone.

## 2024-02-27 NOTE — Telephone Encounter (Signed)
 Patient agreement reviewed and signed on 02/27/2024. Provide a copy to the patient and send the original to HIM for scanning. STOP-BANG score documented in patient's chart (can document in an existing OV encounter if captured at time of visit, or add STOPBANG SmartPhrase to this encounter).  Ensure [facility] order [phone]) is entered as future and signed. Route encounter to CV DIV SLEEP STUDIES pool.  Instructions for Sleep Team: Obtain prior authorization (use BURLPAWATCHPAT SmartPhrase) and route encounter back to covering team member. Covering team member is responsible for calling patient to notify of PA approval and PIN, and to arrange for device pickup and education.

## 2024-03-03 NOTE — Telephone Encounter (Signed)
 Daughter in to pick up [place] [facility] [phone]

## 2024-03-03 NOTE — Telephone Encounter (Signed)
 Watchpat registered and initiated in [employer]

## 2024-03-12 ENCOUNTER — Encounter (INDEPENDENT_AMBULATORY_CARE_PROVIDER_SITE_OTHER): Payer: Self-pay | Admitting: Cardiology

## 2024-03-12 ENCOUNTER — Encounter: Payer: Self-pay | Admitting: Cardiology

## 2024-03-12 DIAGNOSIS — G4733 Obstructive sleep apnea (adult) (pediatric): Secondary | ICD-10-CM | POA: Diagnosis not present

## 2024-03-14 ENCOUNTER — Encounter (INDEPENDENT_AMBULATORY_CARE_PROVIDER_SITE_OTHER): Payer: Self-pay | Admitting: Vascular Surgery

## 2024-03-14 ENCOUNTER — Ambulatory Visit: Attending: Cardiology

## 2024-03-14 ENCOUNTER — Ambulatory Visit (INDEPENDENT_AMBULATORY_CARE_PROVIDER_SITE_OTHER): Admitting: Vascular Surgery

## 2024-03-14 ENCOUNTER — Ambulatory Visit (INDEPENDENT_AMBULATORY_CARE_PROVIDER_SITE_OTHER)

## 2024-03-14 VITALS — BP 136/84 | HR 64 | Resp 18 | Wt 229.4 lb

## 2024-03-14 DIAGNOSIS — I7300 Raynaud's syndrome without gangrene: Secondary | ICD-10-CM

## 2024-03-14 DIAGNOSIS — I83811 Varicose veins of right lower extremities with pain: Secondary | ICD-10-CM

## 2024-03-14 DIAGNOSIS — Z9889 Other specified postprocedural states: Secondary | ICD-10-CM

## 2024-03-14 DIAGNOSIS — I739 Peripheral vascular disease, unspecified: Secondary | ICD-10-CM | POA: Diagnosis not present

## 2024-03-14 DIAGNOSIS — Z9189 Other specified personal risk factors, not elsewhere classified: Secondary | ICD-10-CM

## 2024-03-14 NOTE — Assessment & Plan Note (Signed)
 About 3 years status post extensive right lower extremity revascularization for thrombosis.  ABIs today are 1.14 on the right and 1.13 on the left with triphasic waveforms.  Continue current medical regimen.  Thrombosis was likely related to COVID as well as a COVID booster.  She will continue aspirin , Eliquis , and Lipitor.  Recheck in 1 year.  She can hold her Eliquis  for 2 to 3 days prior to her upcoming knee replacements if need be with minimal risk.

## 2024-03-14 NOTE — Progress Notes (Signed)
 MRN : [id number]  Jade Edwards is a 68 y.o. (February 02, 1956) female who presents with chief complaint of  Chief Complaint  Patient presents with   Follow-up    47yr abi follow up  .  History of Present Illness: Patient returns today in follow up of lower extremity arterial disease.  About 3 years ago, she underwent extensive right lower extremity revascularization for thrombotic issues likely related to a recent infection with COVID as well as a COVID booster.  She has done well since that time.  She has no lifestyle limiting claudication, ischemic rest pain, or ulceration.  ABIs today are 1.14 on the right and 1.13 on the left with triphasic waveforms.  Current Outpatient Medications  Medication Sig Dispense Refill   allopurinol (ZYLOPRIM) 300 MG tablet Take 150 mg by mouth daily.     amphetamine -dextroamphetamine  (ADDERALL ) 20 MG tablet Take 20 mg by mouth daily.     apixaban  (ELIQUIS ) 5 MG TABS tablet Take 1 tablet (5 mg total) by mouth 2 (two) times daily. 60 tablet 0   aspirin  EC 81 MG EC tablet Take 1 tablet (81 mg total) by mouth daily. Swallow whole.     atorvastatin  (LIPITOR) 10 MG tablet Take 1 tablet (10 mg total) by mouth daily. 30 tablet 2   hydrochlorothiazide  (MICROZIDE ) 12.5 MG capsule TAKE 1 CAPSULE(12.5 MG) BY MOUTH DAILY 90 capsule 3   hydroxychloroquine (PLAQUENIL) 200 MG tablet Take 1 tablet by mouth daily.     metoprolol  tartrate (LOPRESSOR ) 50 MG tablet TAKE 1 TABLET(50 MG) BY MOUTH TWICE DAILY 180 tablet 0   nitroGLYCERIN  (NITRO-BID ) 2 % ointment Apply topically as needed.     No current facility-administered medications for this visit.    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 11/2020 Angio: 100% R popliteal artery and all 3 tibial vessels in the proximal segment-->s/p thrombectomy & PTA.   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 02/2021 Echo: EF 60-65%, no rwma. Nl RV fxn. Triv MR.   Obesity    Osteoarthritis of right knee    PAF (paroxysmal atrial fibrillation) (HCC)    a. 01/2021 Zio: 53 runs SVT w/ ? of afib.  Eliquis  started in light of h/o LE embolism.   Primary osteoarthritis of both knees    PSVT (paroxysmal supraventricular tachycardia) (HCC)    a. 01/2021 Zio: 53 SVT runs. Longest/fastest 13.1 secs @ 197. Some SVT possibly AT vs Afib-->Eliquis  started; b. 09/2022 Zio: Predominantly sinus rhythm-average 71 (47-182).  Slight P wave morphology changes noted.  123 SVT runs-fastest 182 bpm x 4 beats.  Longest 12.5 seconds and 135 bpm.  Frequent PACs and rare PVCs.  No triggered events.   Raynaud's disease without gangrene    Rheumatoid factor positive    Stricture and stenosis of esophagus     Past Surgical History:  Procedure Laterality Date   CESAREAN SECTION     ESOPHAGOGASTRODUODENOSCOPY N/A 02/21/2020   Procedure: ESOPHAGOGASTRODUODENOSCOPY (EGD);  Surgeon: Jinny Carmine, MD;  Location: [facility] ENDOSCOPY;  Service: Endoscopy;  Laterality: N/A;   ESOPHAGOGASTRODUODENOSCOPY (EGD) WITH PROPOFOL  N/A 04/20/2020  Procedure: ESOPHAGOGASTRODUODENOSCOPY (EGD) WITH PROPOFOL ;  Surgeon: Jinny Carmine, MD;  Location: [facility] ENDOSCOPY;  Service: Endoscopy;  Laterality: N/A;   LOWER EXTREMITY ANGIOGRAPHY Right 11/18/2020   Procedure: Lower Extremity Angiography;  Surgeon: Marea Selinda RAMAN, MD;  Location: [facility] INVASIVE CV LAB;  Service: Cardiovascular;  Laterality: Right;   VASCULAR SURGERY       Social History   Tobacco Use   Smoking status: Never   Smokeless tobacco: Never  Vaping Use   Vaping status: Never Used  Substance Use Topics   Alcohol use: No   Drug use: No      Family History  Problem Relation Age of Onset    Mental illness Mother    Bipolar disorder Mother    Breast cancer Mother    Depression Mother    Other Mother        DVTs   Lupus Mother    Hyperlipidemia Mother    Rheum arthritis Mother    Stroke Mother    Coronary artery disease Father    Heart attack Father 44   Other Sister        blood clots   Osteoarthritis Daughter    Osteoarthritis Son    Bladder Cancer Neg Hx    Prostate cancer Neg Hx    Kidney cancer Neg Hx      Allergies  Allergen Reactions   Codeine Nausea Only   Epinephrine Palpitations     REVIEW OF SYSTEMS (Negative unless checked)  Constitutional: [] Weight loss  [] Fever  [] Chills Cardiac: [] Chest pain   [] Chest pressure   [] Palpitations   [] Shortness of breath when laying flat   [] Shortness of breath at rest   [] Shortness of breath with exertion. Vascular:  [] Pain in legs with walking   [] Pain in legs at rest   [] Pain in legs when laying flat   [] Claudication   [] Pain in feet when walking  [] Pain in feet at rest  [] Pain in feet when laying flat   [] History of DVT   [] Phlebitis   [] Swelling in legs   [] Varicose veins   [] Non-healing ulcers Pulmonary:   [] Uses home oxygen   [] Productive cough   [] Hemoptysis   [] Wheeze  [] COPD   [] Asthma Neurologic:  [] Dizziness  [] Blackouts   [] Seizures   [] History of stroke   [] History of TIA  [] Aphasia   [] Temporary blindness   [] Dysphagia   [] Weakness or numbness in arms   [] Weakness or numbness in legs Musculoskeletal:  [x] Arthritis   [] Joint swelling   [x] Joint pain   [] Low back pain Hematologic:  [x] Easy bruising  [] Easy bleeding   [] Hypercoagulable state   [] Anemic   Gastrointestinal:  [] Blood in stool   [] Vomiting blood  [] Gastroesophageal reflux/heartburn   [] Abdominal pain Genitourinary:  [] Chronic kidney disease   [] Difficult urination  [] Frequent urination  [] Burning with urination   [] Hematuria Skin:  [] Rashes   [] Ulcers   [] Wounds Psychological:  [] History of anxiety   []  History of major depression.  Physical  Examination  BP 136/84   Pulse 64   Resp 18   Wt 229 lb 6.4 oz (104.1 kg)   BMI 41.96 kg/m  Gen:  WD/WN, NAD Head: [place]/AT, No temporalis wasting. Ear/Nose/Throat: Hearing grossly intact, nares w/o erythema or drainage Eyes: Conjunctiva clear. Sclera non-icteric Neck: Supple.  Trachea midline Pulmonary:  Good air movement, no use of accessory muscles.  Cardiac: RRR, no JVD Vascular:  Vessel Right Left  Radial Palpable Palpable  PT Palpable Palpable  DP Palpable Palpable   Gastrointestinal: soft, non-tender/non-distended. No guarding/reflex.  Musculoskeletal: M/S 5/5 throughout.  No deformity or atrophy. No edema. Neurologic: Sensation grossly intact in extremities.  Symmetrical.  Speech is fluent.  Psychiatric: Judgment intact, Mood & affect appropriate for pt's clinical situation. Dermatologic: No rashes or ulcers noted.  No cellulitis or open wounds.      Labs No results found for this or any previous visit (from the past 2160 hours).  Radiology No results found.  Assessment/Plan  Lower extremity arterial insufficiency, severe, right (HCC) About 3 years status post extensive right lower extremity revascularization for thrombosis.  ABIs today are 1.14 on the right and 1.13 on the left with triphasic waveforms.  Continue current medical regimen.  Thrombosis was likely related to COVID as well as a COVID booster.  She will continue aspirin , Eliquis , and Lipitor.  Recheck in 1 year.  She can hold her Eliquis  for 2 to 3 days prior to her upcoming knee replacements if need be with minimal risk.  Raynaud's disease without gangrene Symptoms stable and long standing.  Contributes to the discoloration and reduced perfusion in the toes.   Varicose veins of right lower extremity with pain Has some venous stasis changes and prominent varicosities on the right leg.  Elevated and compression stockings as needed.  Selinda Gu, MD  03/14/2024 10:34  AM    This note was created with Dragon medical transcription system.  Any errors from dictation are purely unintentional

## 2024-03-14 NOTE — Procedures (Signed)
 SLEEP STUDY REPORT Patient Information Study Date: 03/12/2024 Patient Name: Jade Edwards Patient ID: [id number] Birth Date: 05-06-1956 Age: 68 Gender: Female BMI: 58.5 (W=225 lb, H=4' 4'') Referring Physician: Tylene Lunch, NP  TEST DESCRIPTION: Home sleep apnea testing was completed using the WatchPat, a Type 1 device, utilizing  peripheral arterial tonometry (PAT), chest movement, actigraphy, pulse oximetry, pulse rate, body position and snore.  AHI was calculated with apnea and hypopnea using valid sleep time as the denominator. RDI includes apneas,  hypopneas, and RERAs. The data acquired and the scoring of sleep and all associated events were performed in  accordance with the recommended standards and specifications as outlined in the AASM Manual for the Scoring of  Sleep and Associated Events 2.2.0 (2015).   FINDINGS:   1. Mild Obstructive Sleep Apnea with AHI 7.4/hr.   2. No Central Sleep Apnea with pAHIc 0/hr.   3. Oxygen desaturations as low as 77%.   4. Moderate snoring was present. O2 sats were < 88% for 0.3 min.   5. Total sleep time was2 hrs and 35 min.   6. 16.2% of total sleep time was spent in REM sleep.   7. Shortened sleep onset latency at 6 min.   8. Prolonged REM sleep onset latency at 128 min.   9. Total awakenings were 7 .  10. Arrhythmia detection: Suggestive of possible brief atrial fibrillation lasting 5 minutes and 42 seconds. This is not  diagnostic and further testing with outpatient telemetry monitoring is recommended.  DIAGNOSIS: Mild Obstructive Sleep Apnea (G47.33) Possible Atrial Fibrillation  RECOMMENDATIONS: 1. Clinical correlation of these findings is necessary. The decision to treat obstructive sleep apnea (OSA) is usually  based on the presence of apnea symptoms or the presence of associated medical conditions such as Hypertension,  Congestive Heart Failure, Atrial Fibrillation or Obesity. The most common symptoms of OSA are snoring,  gasping for  breath while sleeping, daytime sleepiness and fatigue.  2. Initiating apnea therapy is recommended given the presence of symptoms and/or associated conditions.  Recommend proceeding with one of the following:  a. Auto-CPAP therapy with a pressure range of 5-20cm H2O.  b. An oral appliance (OA) that can be obtained from certain dentists with expertise in sleep medicine. These are  primarily of use in non-obese patients with mild and moderate disease.  c. An ENT consultation which may be useful to look for specific causes of obstruction and possible treatment  options.  d. If patient is intolerant to PAP therapy, consider referral to ENT for evaluation for hypoglossal nerve stimulator.  3. Close follow-up is necessary to ensure success with CPAP or oral appliance therapy for maximum benefit . 4. A follow-up oximetry study on CPAP is recommended to assess the adequacy of therapy and determine the need  for supplemental oxygen or the potential need for Bi-level therapy. An arterial blood gas to determine the adequacy of  baseline ventilation and oxygenation should also be considered. 5. Healthy sleep recommendations include: adequate nightly sleep (normal 7-9 hrs/night), avoidance of caffeine after  noon and alcohol near bedtime, and maintaining a sleep environment that is cool, dark and quiet. 6. Weight loss for overweight patients is recommended. Even modest amounts of weight loss can significantly  improve the severity of sleep apnea. 7. Snoring recommendations include: weight loss where appropriate, side sleeping, and avoidance of alcohol before  bed. 8. Operation of motor vehicle should be avoided when sleepy.  Signature: Wilbert Bihari, MD; [facility]; Diplomat, American Board of Sleep  Medicine Electronically Signed: 03/14/2024 4:05:15 PM

## 2024-03-17 LAB — VAS US ABI WITH/WO TBI
Left ABI: 1.13
Right ABI: 1.14

## 2024-03-20 ENCOUNTER — Telehealth: Payer: Self-pay

## 2024-03-20 NOTE — Telephone Encounter (Signed)
-----   Message from Wilbert Bihari sent at 03/14/2024  4:06 PM EDT ----- Patient has very mild OSA - set up OV to discuss treatment options.

## 2024-03-20 NOTE — Telephone Encounter (Signed)
 Unable to leave message for sleep study results and recommendations. Will call back in a few days.

## 2024-03-28 NOTE — Progress Notes (Signed)
 This encounter was created in error - please disregard.

## 2024-03-28 NOTE — Procedures (Signed)
 Erroneous encounter

## 2024-04-09 ENCOUNTER — Telehealth: Payer: Self-pay | Admitting: *Deleted

## 2024-04-09 NOTE — Telephone Encounter (Signed)
 The patient has been notified of the result PER her DPR of her results. Message was left on her voicemail and asked her to call our office back to set up OV to discuss treatment options. Joshua Dalton Seip, CMA 04/09/2024 2:07 PM

## 2024-04-15 ENCOUNTER — Other Ambulatory Visit: Payer: Self-pay | Admitting: Family Medicine

## 2024-04-15 DIAGNOSIS — K760 Fatty (change of) liver, not elsewhere classified: Secondary | ICD-10-CM

## 2024-04-15 DIAGNOSIS — G4733 Obstructive sleep apnea (adult) (pediatric): Secondary | ICD-10-CM

## 2024-04-24 ENCOUNTER — Ambulatory Visit

## 2024-04-29 ENCOUNTER — Other Ambulatory Visit: Payer: Self-pay | Admitting: Family Medicine

## 2024-04-29 DIAGNOSIS — Z780 Asymptomatic menopausal state: Secondary | ICD-10-CM

## 2024-04-29 DIAGNOSIS — Z1231 Encounter for screening mammogram for malignant neoplasm of breast: Secondary | ICD-10-CM

## 2024-05-12 ENCOUNTER — Ambulatory Visit
Admission: RE | Admit: 2024-05-12 | Discharge: 2024-05-12 | Disposition: A | Discharge status: Home / Self Care | Source: Ambulatory Visit | Attending: Family Medicine | Admitting: Family Medicine

## 2024-05-12 DIAGNOSIS — G4733 Obstructive sleep apnea (adult) (pediatric): Secondary | ICD-10-CM | POA: Insufficient documentation

## 2024-05-12 DIAGNOSIS — K760 Fatty (change of) liver, not elsewhere classified: Secondary | ICD-10-CM | POA: Insufficient documentation

## 2024-05-29 ENCOUNTER — Ambulatory Visit: Admitting: Cardiovascular Disease

## 2024-07-22 ENCOUNTER — Ambulatory Visit: Attending: Cardiovascular Disease | Admitting: Cardiovascular Disease

## 2024-07-22 ENCOUNTER — Encounter: Payer: Self-pay | Admitting: Cardiovascular Disease

## 2024-07-22 VITALS — BP 110/70 | HR 80 | Ht 63.0 in | Wt 227.0 lb

## 2024-07-22 DIAGNOSIS — E785 Hyperlipidemia, unspecified: Secondary | ICD-10-CM | POA: Diagnosis not present

## 2024-07-22 DIAGNOSIS — Z01810 Encounter for preprocedural cardiovascular examination: Secondary | ICD-10-CM

## 2024-07-22 DIAGNOSIS — I480 Paroxysmal atrial fibrillation: Secondary | ICD-10-CM

## 2024-07-22 DIAGNOSIS — I700 Atherosclerosis of aorta: Secondary | ICD-10-CM

## 2024-07-22 DIAGNOSIS — Z01818 Encounter for other preprocedural examination: Secondary | ICD-10-CM

## 2024-07-22 DIAGNOSIS — I10 Essential (primary) hypertension: Secondary | ICD-10-CM | POA: Diagnosis not present

## 2024-07-22 NOTE — Patient Instructions (Signed)

## 2024-07-22 NOTE — Progress Notes (Unsigned)
 Cardiology Office Note   Date:  07/22/2024   ID:  Jade Edwards, DOB 1956-02-13, MRN [id number]  PCP:  Jade Idelia LABOR, MD  Cardiologist:   Jade Cage, MD   Chief Complaint  Patient presents with   Follow-up    Cardiac clearance for knee surgery. Meds reviewed verbally with pt.      History of Present Illness: Jade Edwards is a 68 y.o. female who was referred by Dr. Zachary for evaluation of source of embolism. She has past medical history of Raynaud's disease, GERD and obesity.  She was hospitalized in April with acute right foot pain secondary to acute limb ischemia.  An angiogram was performed which showed abrupt occlusion of the right popliteal artery as well as occlusion of all 3 tibial vessels in the proximal segment.  She underwent thrombectomy and balloon angioplasty.  She is currently on anticoagulation with Eliquis . She has no prior history of DVT/PE or arterial thrombosis.  There is family history of DVTs and hypercoagulable state.  She underwent hypercoagulable work-up by Dr. Rennie which was negative.  The patient reports intermittent palpitations but no known history of atrial fibrillation.  No history of congenital heart disease or PFO.  She does not have history of aortic disease as far as we know.  She has chronic exertional dyspnea but no chest pain.  She did have COVID-19 infection in January 2 months preceding her presentation with arterial thrombosis/embolism.  She is currently having issues with hematuria. She is a lifelong non-smoker.   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 11/2020 Angio: 100% R popliteal artery and all 3 tibial vessels in the proximal segment-->s/p thrombectomy & PTA.   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 02/2021 Echo: EF 60-65%, no rwma. Nl RV fxn. Triv MR.   Obesity    Osteoarthritis of right knee    PAF (paroxysmal atrial fibrillation) (HCC)    a. 01/2021 Zio: 53 runs SVT w/ ? of afib.  Eliquis  started in light of h/o LE embolism.   Primary osteoarthritis of both knees    PSVT (paroxysmal supraventricular tachycardia)    a. 01/2021 Zio: 53 SVT runs. Longest/fastest 13.1 secs @ 197. Some SVT possibly AT vs Afib-->Eliquis  started; b. 09/2022 Zio: Predominantly sinus rhythm-average 71 (47-182).  Slight P wave morphology changes noted.  123 SVT runs-fastest 182 bpm x 4 beats.  Longest 12.5 seconds and 135 bpm.  Frequent PACs and rare PVCs.  No triggered events.   Raynaud's disease without gangrene    Rheumatoid factor positive    Stricture and stenosis of esophagus     Past Surgical History:  Procedure Laterality Date   CESAREAN SECTION     ESOPHAGOGASTRODUODENOSCOPY N/A 02/21/2020   Procedure: ESOPHAGOGASTRODUODENOSCOPY (EGD);  Surgeon: Jinny Carmine, MD;  Location: [facility] ENDOSCOPY;  Service: Endoscopy;  Laterality: N/A;   ESOPHAGOGASTRODUODENOSCOPY (EGD) WITH PROPOFOL  N/A 04/20/2020   Procedure: ESOPHAGOGASTRODUODENOSCOPY (EGD) WITH PROPOFOL ;  Surgeon: Jinny Carmine, MD;  Location: [facility] ENDOSCOPY;  Service: Endoscopy;  Laterality: N/A;   LOWER EXTREMITY ANGIOGRAPHY Right 11/18/2020   Procedure: Lower Extremity Angiography;  Surgeon: Marea Selinda RAMAN, MD;  Location: [facility] INVASIVE CV LAB;  Service: Cardiovascular;  Laterality: Right;   VASCULAR SURGERY       Current Outpatient Medications  Medication Sig Dispense Refill   allopurinol (ZYLOPRIM) 300 MG tablet Take 150 mg by mouth daily. (Patient taking differently: Take 300 mg by mouth daily.)     amphetamine -dextroamphetamine  (ADDERALL ) 20 MG tablet Take 20 mg by mouth daily.     apixaban  (ELIQUIS ) 5 MG TABS tablet Take 1 tablet (5 mg total) by  mouth 2 (two) times daily. 60 tablet 0   hydrochlorothiazide  (MICROZIDE ) 12.5 MG capsule TAKE 1 CAPSULE(12.5 MG) BY MOUTH DAILY 90 capsule 3   hydroxychloroquine (PLAQUENIL) 200 MG tablet Take 1 tablet by mouth daily.     metoprolol  tartrate (LOPRESSOR ) 50 MG tablet TAKE 1 TABLET(50 MG) BY MOUTH TWICE DAILY 180 tablet 0   nitroGLYCERIN  (NITRO-BID ) 2 % ointment Apply topically as needed.     aspirin  EC 81 MG EC tablet Take 1 tablet (81 mg total) by mouth daily. Swallow whole. (Patient not taking: Reported on 07/22/2024)     atorvastatin  (LIPITOR) 10 MG tablet Take 1 tablet (10 mg total) by mouth daily. (Patient not taking: Reported on 07/22/2024) 30 tablet 2   No current facility-administered medications for this visit.    Allergies:   Atorvastatin , Codeine, and Epinephrine    Social History:  The patient  reports that she has never smoked. She has never used smokeless tobacco. She reports that she does not drink alcohol and does not use drugs.   Family History:  The patient's family history includes Bipolar disorder in her mother; Breast cancer in her mother; Coronary artery disease in her father; Depression in her mother; Heart attack (age of onset: 48) in her father; Hyperlipidemia in her mother; Lupus in her mother; Mental illness in her mother; Osteoarthritis in her daughter and son; Other in her mother and sister; Rheum arthritis in her mother; Stroke in her mother.    ROS:  Please see the history of present illness.   Otherwise, review of systems are positive for none.   All other systems are reviewed and negative.    PHYSICAL EXAM: VS:  BP 110/70 (BP Location: Left Arm, Patient Position: Sitting, Cuff Size: Large)   Pulse 80   Ht 5' 3 (1.6 m)   Wt 227 lb (103 kg)   SpO2 97%   BMI 40.21 kg/m  , BMI Body mass index is 40.21 kg/m. GEN: Well nourished, well developed, in no acute distress  HEENT: normal  Neck: no JVD, carotid bruits, or masses Cardiac: RRR; no murmurs, rubs, or  gallops,no edema  Respiratory:  clear to auscultation bilaterally, normal work of breathing GI: soft, nontender, nondistended, + BS MS: no deformity or atrophy  Skin: warm and dry, no rash Neuro:  Strength and sensation are intact Psych: euthymic mood, full affect   EKG:  EKG is ordered today. The ekg ordered today demonstrates : Normal sinus rhythm Inferior infarct , age undetermined Anterior infarct (cited on or before 18-Nov-2020) When compared with ECG of 27-Feb-2024 09:10, Premature atrial complexes are no longer Present    Recent Labs: No results found for requested labs within last 365 days.    Lipid Panel    Component Value Date/Time   CHOL 209 (H) 11/18/2012 1016   TRIG 172.0 (H) 11/18/2012 1016   HDL 48.30 11/18/2012 1016   CHOLHDL 4 11/18/2012 1016   VLDL 34.4 11/18/2012 1016   LDLCALC  115 (H) 11/22/2011 0822   LDLDIRECT 139.9 11/18/2012 1016      Wt Readings from Last 3 Encounters:  07/22/24 227 lb (103 kg)  03/14/24 229 lb 6.4 oz (104.1 kg)  02/27/24 234 lb 3.2 oz (106.2 kg)           01/14/2021    9:58 AM  PAD Screen  Previous PAD dx? No  Previous surgical procedure? No  Pain with walking? No  Feet/toe relief with dangling? No  Painful, non-healing ulcers? No  Extremities discolored? No      ASSESSMENT AND PLAN:  1.  Status post acute limb ischemia due to abrupt occlusion of the right popliteal artery in March.  This was likely embolic given the location and less likely to be due to in situ thrombosis. I agree that we have to evaluate for cardiac source of embolism.  She does report mild palpitations but no documented history of atrial fibrillation.  I requested a 2-week outpatient monitor. In addition, we will have to exclude the possibility of intracardiac thrombus or PFO.  I requested an echocardiogram. The other possibility which is less likely is an ulcerated plaque in the aorta with thrombus formation.  For that, I requested CTA of the  chest, abdomen and pelvis. We will then have to determine the length of anticoagulation.  2.  Hyperlipidemia: Currently on atorvastatin .    Disposition:   FU with me in 2 months  Signed,  Jade Cage, MD  07/22/2024 3:58 PM    [facility]

## 2025-03-13 ENCOUNTER — Ambulatory Visit (INDEPENDENT_AMBULATORY_CARE_PROVIDER_SITE_OTHER): Admitting: Nurse Practitioner

## 2025-03-13 ENCOUNTER — Encounter (INDEPENDENT_AMBULATORY_CARE_PROVIDER_SITE_OTHER)
